# Patient Record
Sex: Female | Born: 1960 | Race: White | Hispanic: No | Smoking: Former smoker
Health system: Southern US, Community
[De-identification: ages and names within clinical notes are randomized; demographics above are authoritative.]

## PROBLEM LIST (undated history)

## (undated) DIAGNOSIS — Z720 Tobacco use: Secondary | ICD-10-CM

## (undated) DIAGNOSIS — Z9289 Personal history of other medical treatment: Secondary | ICD-10-CM

## (undated) DIAGNOSIS — Z8701 Personal history of pneumonia (recurrent): Secondary | ICD-10-CM

## (undated) DIAGNOSIS — Z8489 Family history of other specified conditions: Secondary | ICD-10-CM

## (undated) DIAGNOSIS — I10 Essential (primary) hypertension: Secondary | ICD-10-CM

## (undated) DIAGNOSIS — I428 Other cardiomyopathies: Secondary | ICD-10-CM

## (undated) DIAGNOSIS — Q390 Atresia of esophagus without fistula: Secondary | ICD-10-CM

## (undated) HISTORY — PX: TUBAL LIGATION: SHX77

## (undated) HISTORY — PX: TONSILLECTOMY: SUR1361

## (undated) HISTORY — DX: Personal history of other medical treatment: Z92.89

## (undated) HISTORY — PX: ESOPHAGUS SURGERY: SHX626

---

## 1998-04-11 ENCOUNTER — Inpatient Hospital Stay: Admission: EM | Admit: 1998-04-11 | Discharge: 1998-04-24 | Payer: Self-pay | Admitting: Emergency Medicine

## 1998-04-11 ENCOUNTER — Encounter: Payer: Self-pay | Admitting: Emergency Medicine

## 1998-04-14 ENCOUNTER — Encounter: Payer: Self-pay | Admitting: Internal Medicine

## 1998-04-15 ENCOUNTER — Encounter: Payer: Self-pay | Admitting: Internal Medicine

## 1998-04-16 ENCOUNTER — Encounter: Payer: Self-pay | Admitting: Internal Medicine

## 1998-04-17 ENCOUNTER — Encounter: Payer: Self-pay | Admitting: Thoracic Surgery

## 1998-04-18 ENCOUNTER — Encounter: Payer: Self-pay | Admitting: Thoracic Surgery

## 1998-04-19 ENCOUNTER — Encounter: Payer: Self-pay | Admitting: Thoracic Surgery

## 1998-04-20 ENCOUNTER — Encounter: Payer: Self-pay | Admitting: Thoracic Surgery

## 1998-04-21 ENCOUNTER — Encounter: Payer: Self-pay | Admitting: Thoracic Surgery

## 1998-04-22 ENCOUNTER — Encounter: Payer: Self-pay | Admitting: Thoracic Surgery

## 1998-04-23 ENCOUNTER — Encounter: Payer: Self-pay | Admitting: Thoracic Surgery

## 1998-04-24 ENCOUNTER — Encounter: Payer: Self-pay | Admitting: Thoracic Surgery

## 1999-05-07 ENCOUNTER — Other Ambulatory Visit: Admission: RE | Admit: 1999-05-07 | Discharge: 1999-05-07 | Payer: Self-pay | Admitting: Obstetrics and Gynecology

## 1999-08-10 ENCOUNTER — Ambulatory Visit (HOSPITAL_COMMUNITY): Admission: RE | Admit: 1999-08-10 | Discharge: 1999-08-10 | Payer: Self-pay | Admitting: Obstetrics and Gynecology

## 2001-08-14 ENCOUNTER — Emergency Department (HOSPITAL_COMMUNITY): Admission: EM | Admit: 2001-08-14 | Discharge: 2001-08-14 | Payer: Self-pay | Admitting: Emergency Medicine

## 2006-01-16 ENCOUNTER — Encounter: Admission: RE | Admit: 2006-01-16 | Discharge: 2006-01-16 | Payer: Self-pay | Admitting: Orthopaedic Surgery

## 2006-02-06 ENCOUNTER — Encounter: Admission: RE | Admit: 2006-02-06 | Discharge: 2006-02-06 | Payer: Self-pay | Admitting: Orthopaedic Surgery

## 2012-09-10 ENCOUNTER — Encounter (HOSPITAL_COMMUNITY): Payer: Self-pay | Admitting: Emergency Medicine

## 2012-09-10 ENCOUNTER — Emergency Department (HOSPITAL_COMMUNITY)
Admission: EM | Admit: 2012-09-10 | Discharge: 2012-09-10 | Disposition: A | Discharge status: Home / Self Care | Payer: BC Managed Care – PPO | Attending: Emergency Medicine | Admitting: Emergency Medicine

## 2012-09-10 ENCOUNTER — Emergency Department (HOSPITAL_COMMUNITY): Payer: BC Managed Care – PPO

## 2012-09-10 DIAGNOSIS — F17200 Nicotine dependence, unspecified, uncomplicated: Secondary | ICD-10-CM | POA: Insufficient documentation

## 2012-09-10 DIAGNOSIS — Z23 Encounter for immunization: Secondary | ICD-10-CM | POA: Insufficient documentation

## 2012-09-10 DIAGNOSIS — Y9289 Other specified places as the place of occurrence of the external cause: Secondary | ICD-10-CM | POA: Insufficient documentation

## 2012-09-10 DIAGNOSIS — S61209A Unspecified open wound of unspecified finger without damage to nail, initial encounter: Secondary | ICD-10-CM | POA: Insufficient documentation

## 2012-09-10 DIAGNOSIS — S6710XA Crushing injury of unspecified finger(s), initial encounter: Secondary | ICD-10-CM | POA: Insufficient documentation

## 2012-09-10 DIAGNOSIS — Y9389 Activity, other specified: Secondary | ICD-10-CM | POA: Insufficient documentation

## 2012-09-10 DIAGNOSIS — S61219A Laceration without foreign body of unspecified finger without damage to nail, initial encounter: Secondary | ICD-10-CM

## 2012-09-10 DIAGNOSIS — R209 Unspecified disturbances of skin sensation: Secondary | ICD-10-CM | POA: Insufficient documentation

## 2012-09-10 DIAGNOSIS — W230XXA Caught, crushed, jammed, or pinched between moving objects, initial encounter: Secondary | ICD-10-CM | POA: Insufficient documentation

## 2012-09-10 MED ORDER — OXYCODONE-ACETAMINOPHEN 5-325 MG PO TABS
1.0000 | ORAL_TABLET | Freq: Once | ORAL | Status: AC
  Administered 2012-09-10: 1 via ORAL
  Filled 2012-09-10: qty 1

## 2012-09-10 MED ORDER — TETANUS-DIPHTH-ACELL PERTUSSIS 5-2.5-18.5 LF-MCG/0.5 IM SUSP
0.5000 mL | Freq: Once | INTRAMUSCULAR | Status: AC
  Administered 2012-09-10: 0.5 mL via INTRAMUSCULAR
  Filled 2012-09-10: qty 0.5

## 2012-09-10 MED ORDER — HYDROCODONE-ACETAMINOPHEN 5-325 MG PO TABS: 1.0000 | ORAL_TABLET | ORAL | Status: DC | PRN

## 2012-09-10 MED ORDER — IBUPROFEN 400 MG PO TABS
400.0000 mg | ORAL_TABLET | Freq: Once | ORAL | Status: AC
  Administered 2012-09-10: 400 mg via ORAL
  Filled 2012-09-10: qty 1

## 2012-09-10 NOTE — ED Provider Notes (Signed)
History    CSN: [id number] Arrival date & time 09/10/12  1948  First MD Initiated Contact with Patient 09/10/12 2044     Chief Complaint  Patient presents with  . Finger Injury   HPI  History provided by the patient. Patient is a 52 year old female who presents with left finger injuries and laceration. Patient asked me smashed her left index and middle fingers in a car door around 6:30 PM prior to arrival. Patient did use peroxide and bandaging over her injuries and wounds. There was small amounts of bleeding associated with injury. She denies any other injury or complaints. There is very slight numbness to the tips of fingers. No other weakness or reduced range of motion. Patient is unsure of her last tetanus shot. Pain is moderate. No other aggravating or alleviating factors. No other associated symptoms.    History reviewed. No pertinent past medical history. Past Surgical History  Procedure Laterality Date  . Tubal ligation     No family history on file. History  Substance Use Topics  . Smoking status: Current Every Day Smoker  . Smokeless tobacco: Not on file  . Alcohol Use: Yes   OB History   Grav Para Term Preterm Abortions TAB SAB Ect Mult Living                 Review of Systems  All other systems reviewed and are negative.    Allergies  Sulfa antibiotics  Home Medications   Current Outpatient Rx  Name  Route  Sig  Dispense  Refill  . Calcium Carbonate-Vitamin D (CALTRATE 600+D PO)   Oral   Take 1 tablet by mouth daily.         Marland Kitchen ibuprofen (ADVIL,MOTRIN) 200 MG tablet   Oral   Take 600 mg by mouth once.         Bertram Gala Glycol-Propyl Glycol (SYSTANE) 0.4-0.3 % GEL   Ophthalmic   Apply 2 drops to eye daily as needed (for allergies).          BP 173/149  Pulse 114  Temp(Src) 98.4 F (36.9 C) (Oral)  Resp 20  SpO2 99% Physical Exam  Nursing note and vitals reviewed. Constitutional: She is oriented to person, place, and time. She  appears well-developed and well-nourished. No distress.  HENT:  Head: Normocephalic.  Cardiovascular: Normal rate and regular rhythm.   Pulmonary/Chest: Effort normal and breath sounds normal. No respiratory distress.  Musculoskeletal: She exhibits tenderness.  Moderate swelling and tenderness to the distal right second and third digits. There is a laceration to the pad of the third digit. Bleeding controlled. There is normal sensation to the medial and lateral aspects of both distal fingertips. Normal cap refill less than 2 seconds. Full range of motion.  Neurological: She is alert and oriented to person, place, and time.  Skin: Skin is warm and dry.  Psychiatric: She has a normal mood and affect. Her behavior is normal.    ED Course  Procedures   LACERATION REPAIR Performed by: Angus Seller Authorized by: Angus Seller Consent: Verbal consent obtained. Risks and benefits: risks, benefits and alternatives were discussed Consent given by: patient Patient identity confirmed: provided demographic data Prepped and Draped in normal sterile fashion Wound explored  Laceration Location: Right distal middle finger  Laceration Length: 1 cm  No Foreign Bodies seen or palpated  Anesthesia: Digital block   Local anesthetic: lidocaine 2% without epinephrine  Anesthetic total: 4 ml  Irrigation method: syringe Amount of  cleaning: standard  Skin closure: Skin with 4-0 Vicryl   Number of sutures: 5   Technique: Simple interrupted   Patient tolerance: Patient tolerated the procedure well with no immediate complications.      Dg Finger Index Right  09/10/2012   *RADIOLOGY REPORT*  Clinical Data: Trauma.  Pain and swelling.  RIGHT INDEX FINGER 2+V,RIGHT MIDDLE FINGER 2+V  Comparison: None.  Findings: 3 views of the right index finger demonstrate no fracture or dislocation.  There may be mild soft tissue swelling, especially about the proximal phalanx.  Three views centered about  the middle finger demonstrate a bandage overlying the middle and distal phalanx. No acute fracture or dislocation.  Soft tissue swelling identified about the proximal phalanx.  IMPRESSION: Soft tissue swelling about the index and middle fingers of the right hand, without acute osseous finding.   Original Report Authenticated By: Jeronimo Greaves, M.D.   Dg Finger Middle Right  09/10/2012   *RADIOLOGY REPORT*  Clinical Data: Trauma.  Pain and swelling.  RIGHT INDEX FINGER 2+V,RIGHT MIDDLE FINGER 2+V  Comparison: None.  Findings: 3 views of the right index finger demonstrate no fracture or dislocation.  There may be mild soft tissue swelling, especially about the proximal phalanx.  Three views centered about the middle finger demonstrate a bandage overlying the middle and distal phalanx. No acute fracture or dislocation.  Soft tissue swelling identified about the proximal phalanx.  IMPRESSION: Soft tissue swelling about the index and middle fingers of the right hand, without acute osseous finding.   Original Report Authenticated By: Jeronimo Greaves, M.D.    1. Crush injury to finger, initial encounter   2. Laceration of finger of right hand, initial encounter       MDM  Patient seen and evaluated. She appears in moderate discomfort in no acute distress. X-rays without any fractures to the fingers. No other concerning findings on exam.      Angus Seller, PA-C 09/10/12 2243

## 2012-09-10 NOTE — ED Notes (Signed)
PT. ACCIDENTALLY CAUGHT  HER RIGHT RIGHT DISTAL MIDDLE /INDEXFINGER AGAINST CAR DOOR THIS EVENING .

## 2012-09-10 NOTE — ED Notes (Signed)
MD at bedside. 

## 2012-09-10 NOTE — ED Provider Notes (Signed)
Medical screening examination/treatment/procedure(s) were performed by non-physician practitioner and as supervising physician I was immediately available for consultation/collaboration.  Jensine Luz, MD 09/10/12 2317 

## 2012-09-10 NOTE — ED Notes (Signed)
Suture cart placed at bedside. 

## 2015-08-12 ENCOUNTER — Inpatient Hospital Stay (HOSPITAL_COMMUNITY): Payer: 59

## 2015-08-12 ENCOUNTER — Observation Stay (HOSPITAL_COMMUNITY)
Admission: EM | Admit: 2015-08-12 | Discharge: 2015-08-14 | Disposition: A | Discharge status: Home / Self Care | Payer: 59 | Attending: Oncology | Admitting: Oncology

## 2015-08-12 ENCOUNTER — Encounter (HOSPITAL_COMMUNITY): Payer: Self-pay | Admitting: *Deleted

## 2015-08-12 ENCOUNTER — Emergency Department (HOSPITAL_COMMUNITY): Payer: 59

## 2015-08-12 DIAGNOSIS — I160 Hypertensive urgency: Secondary | ICD-10-CM | POA: Diagnosis not present

## 2015-08-12 DIAGNOSIS — F1910 Other psychoactive substance abuse, uncomplicated: Secondary | ICD-10-CM | POA: Diagnosis not present

## 2015-08-12 DIAGNOSIS — I509 Heart failure, unspecified: Secondary | ICD-10-CM

## 2015-08-12 DIAGNOSIS — I10 Essential (primary) hypertension: Secondary | ICD-10-CM | POA: Diagnosis not present

## 2015-08-12 DIAGNOSIS — R0600 Dyspnea, unspecified: Secondary | ICD-10-CM

## 2015-08-12 DIAGNOSIS — Z720 Tobacco use: Secondary | ICD-10-CM

## 2015-08-12 DIAGNOSIS — F17200 Nicotine dependence, unspecified, uncomplicated: Secondary | ICD-10-CM

## 2015-08-12 DIAGNOSIS — F17210 Nicotine dependence, cigarettes, uncomplicated: Secondary | ICD-10-CM | POA: Diagnosis not present

## 2015-08-12 DIAGNOSIS — I5042 Chronic combined systolic (congestive) and diastolic (congestive) heart failure: Secondary | ICD-10-CM

## 2015-08-12 DIAGNOSIS — J440 Chronic obstructive pulmonary disease with acute lower respiratory infection: Secondary | ICD-10-CM | POA: Insufficient documentation

## 2015-08-12 DIAGNOSIS — I429 Cardiomyopathy, unspecified: Secondary | ICD-10-CM | POA: Diagnosis not present

## 2015-08-12 DIAGNOSIS — I428 Other cardiomyopathies: Secondary | ICD-10-CM

## 2015-08-12 DIAGNOSIS — J810 Acute pulmonary edema: Secondary | ICD-10-CM | POA: Diagnosis not present

## 2015-08-12 DIAGNOSIS — I110 Hypertensive heart disease with heart failure: Secondary | ICD-10-CM | POA: Diagnosis not present

## 2015-08-12 DIAGNOSIS — R0689 Other abnormalities of breathing: Secondary | ICD-10-CM

## 2015-08-12 DIAGNOSIS — J449 Chronic obstructive pulmonary disease, unspecified: Secondary | ICD-10-CM | POA: Diagnosis not present

## 2015-08-12 DIAGNOSIS — I5041 Acute combined systolic (congestive) and diastolic (congestive) heart failure: Secondary | ICD-10-CM | POA: Insufficient documentation

## 2015-08-12 DIAGNOSIS — R0603 Acute respiratory distress: Secondary | ICD-10-CM | POA: Diagnosis present

## 2015-08-12 HISTORY — DX: Personal history of pneumonia (recurrent): Z87.01

## 2015-08-12 HISTORY — DX: Tobacco use: Z72.0

## 2015-08-12 HISTORY — DX: Essential (primary) hypertension: I10

## 2015-08-12 HISTORY — DX: Family history of other specified conditions: Z84.89

## 2015-08-12 HISTORY — DX: Other cardiomyopathies: I42.8

## 2015-08-12 HISTORY — DX: Atresia of esophagus without fistula: Q39.0

## 2015-08-12 LAB — BASIC METABOLIC PANEL
ANION GAP: 14 (ref 5–15)
BUN: 9 mg/dL (ref 6–20)
CHLORIDE: 107 mmol/L (ref 101–111)
CO2: 20 mmol/L — ABNORMAL LOW (ref 22–32)
CREATININE: 1.06 mg/dL — AB (ref 0.44–1.00)
Calcium: 8.9 mg/dL (ref 8.9–10.3)
GFR calc non Af Amer: 59 mL/min — ABNORMAL LOW (ref >60)
Glucose, Bld: 192 mg/dL — ABNORMAL HIGH (ref 65–99)
POTASSIUM: 4.2 mmol/L (ref 3.5–5.1)
SODIUM: 141 mmol/L (ref 135–145)

## 2015-08-12 LAB — TROPONIN I
TROPONIN I: 0.06 ng/mL — AB (ref <0.031)
Troponin I: 0.06 ng/mL — ABNORMAL HIGH (ref <0.031)

## 2015-08-12 LAB — LIPID PANEL
CHOL/HDL RATIO: 3.4 ratio
CHOLESTEROL: 247 mg/dL — AB (ref 0–200)
HDL: 72 mg/dL (ref >40)
LDL Cholesterol: 160 mg/dL — ABNORMAL HIGH (ref 0–99)
TRIGLYCERIDES: 76 mg/dL (ref <150)
VLDL: 15 mg/dL (ref 0–40)

## 2015-08-12 LAB — CBC WITH DIFFERENTIAL/PLATELET
BASOS ABS: 0.1 10*3/uL (ref 0.0–0.1)
BASOS PCT: 0 %
EOS ABS: 0.1 10*3/uL (ref 0.0–0.7)
Eosinophils Relative: 1 %
HEMATOCRIT: 42.3 % (ref 36.0–46.0)
HEMOGLOBIN: 13.6 g/dL (ref 12.0–15.0)
Lymphocytes Relative: 26 %
Lymphs Abs: 3.9 10*3/uL (ref 0.7–4.0)
MCH: 30.8 pg (ref 26.0–34.0)
MCHC: 32.2 g/dL (ref 30.0–36.0)
MCV: 95.9 fL (ref 78.0–100.0)
Monocytes Absolute: 0.3 10*3/uL (ref 0.1–1.0)
Monocytes Relative: 2 %
NEUTROS ABS: 10.8 10*3/uL — AB (ref 1.7–7.7)
NEUTROS PCT: 71 %
Platelets: 342 10*3/uL (ref 150–400)
RBC: 4.41 MIL/uL (ref 3.87–5.11)
RDW: 14 % (ref 11.5–15.5)
WBC: 15.2 10*3/uL — AB (ref 4.0–10.5)

## 2015-08-12 LAB — I-STAT ARTERIAL BLOOD GAS, ED
ACID-BASE DEFICIT: 6 mmol/L — AB (ref 0.0–2.0)
Bicarbonate: 19.1 mEq/L — ABNORMAL LOW (ref 20.0–24.0)
O2 Saturation: 96 %
PH ART: 7.351 (ref 7.350–7.450)
TCO2: 20 mmol/L (ref 0–100)
pCO2 arterial: 34.6 mmHg — ABNORMAL LOW (ref 35.0–45.0)
pO2, Arterial: 83 mmHg (ref 80.0–100.0)

## 2015-08-12 LAB — RAPID URINE DRUG SCREEN, HOSP PERFORMED
AMPHETAMINES: NOT DETECTED
BARBITURATES: NOT DETECTED
BENZODIAZEPINES: NOT DETECTED
COCAINE: NOT DETECTED
OPIATES: POSITIVE — AB
TETRAHYDROCANNABINOL: NOT DETECTED

## 2015-08-12 LAB — GLUCOSE, CAPILLARY: Glucose-Capillary: 154 mg/dL — ABNORMAL HIGH (ref 65–99)

## 2015-08-12 LAB — BRAIN NATRIURETIC PEPTIDE: B NATRIURETIC PEPTIDE 5: 1043 pg/mL — AB (ref 0.0–100.0)

## 2015-08-12 LAB — I-STAT TROPONIN, ED: TROPONIN I, POC: 0.03 ng/mL (ref 0.00–0.08)

## 2015-08-12 LAB — D-DIMER, QUANTITATIVE (NOT AT ARMC): D DIMER QUANT: 2.82 ug{FEU}/mL — AB (ref 0.00–0.50)

## 2015-08-12 LAB — PROCALCITONIN: Procalcitonin: <0.1 ng/mL

## 2015-08-12 MED ORDER — FUROSEMIDE 10 MG/ML IJ SOLN
40.0000 mg | Freq: Once | INTRAMUSCULAR | Status: AC
  Administered 2015-08-12: 40 mg via INTRAVENOUS
  Filled 2015-08-12: qty 4

## 2015-08-12 MED ORDER — SODIUM CHLORIDE 0.9% FLUSH
3.0000 mL | Freq: Two times a day (BID) | INTRAVENOUS | Status: DC
  Administered 2015-08-12 – 2015-08-14 (×2): 3 mL via INTRAVENOUS

## 2015-08-12 MED ORDER — IPRATROPIUM-ALBUTEROL 0.5-2.5 (3) MG/3ML IN SOLN: 3.0000 mL | RESPIRATORY_TRACT | Status: DC | PRN

## 2015-08-12 MED ORDER — ACETAMINOPHEN 325 MG PO TABS: 650.0000 mg | ORAL_TABLET | Freq: Four times a day (QID) | ORAL | Status: DC | PRN

## 2015-08-12 MED ORDER — IOPAMIDOL (ISOVUE-370) INJECTION 76%
INTRAVENOUS | Status: AC
  Administered 2015-08-12: 80 mL
  Filled 2015-08-12: qty 100

## 2015-08-12 MED ORDER — SENNOSIDES-DOCUSATE SODIUM 8.6-50 MG PO TABS: 1.0000 | ORAL_TABLET | Freq: Every evening | ORAL | Status: DC | PRN

## 2015-08-12 MED ORDER — IOPAMIDOL (ISOVUE-370) INJECTION 76%
INTRAVENOUS | Status: AC
  Filled 2015-08-12: qty 100

## 2015-08-12 MED ORDER — LABETALOL HCL 5 MG/ML IV SOLN
5.0000 mg | INTRAVENOUS | Status: DC | PRN
  Administered 2015-08-12: 5 mg via INTRAVENOUS
  Filled 2015-08-12: qty 4

## 2015-08-12 MED ORDER — IPRATROPIUM-ALBUTEROL 0.5-2.5 (3) MG/3ML IN SOLN
3.0000 mL | Freq: Four times a day (QID) | RESPIRATORY_TRACT | Status: DC
  Administered 2015-08-12 (×2): 3 mL via RESPIRATORY_TRACT
  Filled 2015-08-12 (×2): qty 3

## 2015-08-12 MED ORDER — NITROGLYCERIN 2 % TD OINT
1.0000 [in_us] | TOPICAL_OINTMENT | Freq: Once | TRANSDERMAL | Status: AC
  Administered 2015-08-12: 1 [in_us] via TOPICAL
  Filled 2015-08-12: qty 1

## 2015-08-12 MED ORDER — IPRATROPIUM-ALBUTEROL 0.5-2.5 (3) MG/3ML IN SOLN
3.0000 mL | Freq: Once | RESPIRATORY_TRACT | Status: AC
  Administered 2015-08-12: 3 mL via RESPIRATORY_TRACT
  Filled 2015-08-12: qty 3

## 2015-08-12 MED ORDER — ACETAMINOPHEN 650 MG RE SUPP: 650.0000 mg | Freq: Four times a day (QID) | RECTAL | Status: DC | PRN

## 2015-08-12 MED ORDER — ENOXAPARIN SODIUM 40 MG/0.4ML ~~LOC~~ SOLN
40.0000 mg | SUBCUTANEOUS | Status: DC
Start: 2015-08-12 — End: 2015-08-14
  Administered 2015-08-12 – 2015-08-13 (×2): 40 mg via SUBCUTANEOUS
  Filled 2015-08-12 (×2): qty 0.4

## 2015-08-12 MED ORDER — IPRATROPIUM-ALBUTEROL 0.5-2.5 (3) MG/3ML IN SOLN
3.0000 mL | Freq: Three times a day (TID) | RESPIRATORY_TRACT | Status: DC
  Administered 2015-08-13 – 2015-08-14 (×4): 3 mL via RESPIRATORY_TRACT
  Filled 2015-08-12 (×5): qty 3

## 2015-08-12 MED ORDER — ASPIRIN 81 MG PO CHEW
324.0000 mg | CHEWABLE_TABLET | Freq: Once | ORAL | Status: AC
Start: 2015-08-12 — End: 2015-08-12
  Administered 2015-08-12: 324 mg via ORAL
  Filled 2015-08-12: qty 4

## 2015-08-12 MED ORDER — LISINOPRIL 10 MG PO TABS
5.0000 mg | ORAL_TABLET | Freq: Every day | ORAL | Status: DC
Start: 2015-08-12 — End: 2015-08-12
  Administered 2015-08-12: 5 mg via ORAL
  Filled 2015-08-12: qty 1

## 2015-08-12 NOTE — ED Notes (Signed)
When patient taken off C PAP to reposition in the bed, sats dropped to 88%  BiPAP applied

## 2015-08-12 NOTE — ED Provider Notes (Addendum)
CSN: [id number]     Arrival date & time 08/12/15  4098 History   First MD Initiated Contact with Patient 08/12/15 0700     Chief Complaint  Patient presents with  . Respiratory Distress     (Consider location/radiation/quality/duration/timing/severity/associated sxs/prior Treatment) HPI Patient presents with shortness of breath by EMS. Acute onset of shortness of breath this morning after getting out of shower. She is a smoker but no history of CHF or COPD. O2 sats reported in the 70s by EMS. She was placed on BiPAP for work of breathing. They noted wheezing and she received 10mg  albuterol and 125mg  solumedrol which she states made her feel better. Hypertensive and tachycardic in route. Denies chest pain. No leg swelling.No H/O DVT. Reports 2 weeks of dry cough with no fever. Smoke about 1 PPD. Had an inhaler in the past but not now. No known H/O COPD. Past Medical History  Diagnosis Date  . Hypertension    Past Surgical History  Procedure Laterality Date  . Tubal ligation     No family history on file. Social History  Substance Use Topics  . Smoking status: Current Every Day Smoker  . Smokeless tobacco: Never Used  . Alcohol Use: Yes   OB History    No data available     Review of Systems 10 Systems reviewed and are negative for acute change except as noted in the HPI.   Allergies  Sulfa antibiotics  Home Medications   Prior to Admission medications   Medication Sig Start Date End Date Taking? Authorizing Provider  Calcium Carbonate-Vitamin D (CALTRATE 600+D PO) Take 1 tablet by mouth daily.    Historical Provider, MD  HYDROcodone-acetaminophen (NORCO) 5-325 MG per tablet Take 1-2 tablets by mouth every 4 (four) hours as needed for pain. 09/10/12   Ivonne Andrew, PA-C  ibuprofen (ADVIL,MOTRIN) 200 MG tablet Take 600 mg by mouth once.    Historical Provider, MD  Polyethyl Glycol-Propyl Glycol (SYSTANE) 0.4-0.3 % GEL Apply 2 drops to eye daily as needed (for allergies).     Historical Provider, MD   BP 166/92 mmHg  Pulse 86  Temp(Src) 97.8 F (36.6 C) (Rectal)  Resp 22  SpO2 100% Physical Exam  Constitutional: She is oriented to person, place, and time. She appears well-developed and well-nourished.  Moderate increased work of breathing on BiPAP. Patient is awake and alert. She is able to speak in short. Sentences. Color is good. Mental status is clear.  HENT:  Head: Normocephalic and atraumatic.  Eyes: EOM are normal. Pupils are equal, round, and reactive to light.  Neck: Neck supple.  Cardiovascular: Normal rate, regular rhythm, normal heart sounds and intact distal pulses.   Pulmonary/Chest:  Moderate increased work of breathing. Right lung field clear. Left lung field Rales at left base posteriorly. Anterior auscultation some expiratory wheeze present. On the left only.  Abdominal: Soft. Bowel sounds are normal. She exhibits no distension. There is no tenderness.  Musculoskeletal: Normal range of motion. She exhibits no edema or tenderness.  Lower extremities excellent condition. No peripheral edema. Skin condition very good. Calves soft nontender.  Neurological: She is alert and oriented to person, place, and time. She has normal strength. She exhibits normal muscle tone. Coordination normal. GCS eye subscore is 4. GCS verbal subscore is 5. GCS motor subscore is 6.  Skin: Skin is warm, dry and intact.  Psychiatric: She has a normal mood and affect.    ED Course  Procedures (including critical care time) CRITICAL CARE  Performed by: Arby Barrette   Total critical care time: 45 minutes  Critical care time was exclusive of separately billable procedures and treating other patients.  Critical care was necessary to treat or prevent imminent or life-threatening deterioration.  Critical care was time spent personally by me on the following activities: development of treatment plan with patient and/or surrogate as well as nursing, discussions with  consultants, evaluation of patient's response to treatment, examination of patient, obtaining history from patient or surrogate, ordering and performing treatments and interventions, ordering and review of laboratory studies, ordering and review of radiographic studies, pulse oximetry and re-evaluation of patient's condition. Labs Review Labs Reviewed  CBC WITH DIFFERENTIAL/PLATELET - Abnormal; Notable for the following:    WBC 15.2 (*)    Neutro Abs 10.8 (*)    All other components within normal limits  BASIC METABOLIC PANEL - Abnormal; Notable for the following:    CO2 20 (*)    Glucose, Bld 192 (*)    Creatinine, Ser 1.06 (*)    GFR calc non Af Amer 59 (*)    All other components within normal limits  BRAIN NATRIURETIC PEPTIDE - Abnormal; Notable for the following:    B Natriuretic Peptide 1043.0 (*)    All other components within normal limits  D-DIMER, QUANTITATIVE (NOT AT [facility]) - Abnormal; Notable for the following:    D-Dimer, Quant 2.82 (*)    All other components within normal limits  I-STAT ARTERIAL BLOOD GAS, ED - Abnormal; Notable for the following:    pCO2 arterial 34.6 (*)    Bicarbonate 19.1 (*)    Acid-base deficit 6.0 (*)    All other components within normal limits  I-STAT TROPOININ, ED    Imaging Review Dg Chest Portable 1 View  08/12/2015  CLINICAL DATA:  Shortness of breath and cough for 2 weeks, history hypertension, smoking EXAM: PORTABLE CHEST 1 VIEW COMPARISON:  Portable exam 0716 hours ; prior studies from 2000 predate PACs and not available for comparison FINDINGS: Enlargement of cardiac silhouette. Mediastinal contours normal. Atelectasis versus scarring at minor fissure. Interstitial infiltrates in the mid to lower lungs bilaterally could reflect acute edema, atypical infection or fibrosis, stability uncertain in the absence of prior exams. No pleural effusion or pneumothorax. Bones unremarkable. IMPRESSION: Enlargement of cardiac silhouette. Interstitial  infiltrates in the mid to lower lungs question edema, atypical infection or fibrosis. Electronically Signed   By: Ulyses Southward M.D.   On: 08/12/2015 07:38   I have personally reviewed and evaluated these images and lab results as part of my medical decision-making.   EKG Interpretation   Date/Time:  Wednesday August 12 2015 06:56:23 EDT Ventricular Rate:  114 PR Interval:  166 QRS Duration: 107 QT Interval:  312 QTC Calculation: 430 R Axis:   80 Text Interpretation:  Sinus tachycardia Biatrial enlargement LVH with  secondary repolarization abnormality Confirmed by HORTON  MD, COURTNEY  ([id number]) on 08/12/2015 7:00:08 AM     Recheck of 9:10. Patient is off BiPAP and vital signs are stable. She is on nonrebreather mask and is speaking in full sentences with mild increased work of breathing but no signs of fatigue or mental status change. Patient denies any chest pain. She reports throughout all of this she has never experienced any chest pain.  Consult: (09:50) Internal medicine Dr. Bronson Curb will see in the emergency department for admission. Consult: (10:15) Trish of cardiology will assign for cardiology consultation. MDM   Final diagnoses:  Acute congestive heart failure, unspecified  congestive heart failure type (HCC)  Dyspnea and respiratory abnormalities   Patient presents with very acute exacerbation of shortness of breath. She identified some dry cough occasionally for a couple weeks but no significant symptoms. This morning she became very dyspneic quickly. Patient had no associated chest pain. She has had hypertension that has been untreated at times. She reports that she has been compliant with blood pressure medications more recently but does not get regular medical care. She does continue to smoke. Chest x-ray showed cardiomegaly with vascular congestion, EKG shows LVH and pulmonary examination had basilar Rales on the left. Troponin is not elevated and patient has no chest pain at this  time there is not evidence of acute ischemic cardiac event. Patient is treated with IV Lasix and nitroglycerin paste for acute pulmonary edema consistent with acute CHF.    Arby Barrette, MD 08/12/15 1019  Arby Barrette, MD 08/12/15 1335

## 2015-08-12 NOTE — ED Notes (Signed)
Pt ambulated to restroom from room. Pt stated she felt more short of breath while walking to restroom, but no complaints of SOB on the way back. Pt placed back onto monitor, O2 RM was 92%.

## 2015-08-12 NOTE — H&P (Signed)
Date: 08/12/2015               Patient Name:  Rachel Kane MRN: [id number]  DOB: June 26, 1960 Age / Sex: 55 y.o., female   PCP: No Pcp Per Patient           Medical Service: Internal Medicine Teaching Service         Attending Physician: Dr. Levert Feinstein, MD    First Contact: Dr. Ladona Ridgel, MD Pager: [phone]  Second Contact: Dr. Allena Katz, MD Pager: [phone]       After Hours (After 5p/  First Contact Pager: [phone]  weekends / holidays): Second Contact Pager: [phone]    Most Recent Discharge Date:  09/10/12  Chief Complaint:  Chief Complaint  Patient presents with  . Respiratory Distress       History of Present Illness:  Rachel Kane is a 55 y.o. female with a PMH of HTN, pneumonia, and bronchitis presents to the ED with acute onset of dyspnea this AM.  She reports waking up around 4AM to urinate and tried to go back to sleep and woke up around 5AM with shortness of breath.  She endorses some diaphoresis with this episode and chronic nonproductive cough.  She denies associated wheezing, chest pain, palpitations, fever/chills, hemoptysis, N/V, decreased appetite. Denies orthopnea, dyspnea with exertion, LE swelling, history of VTE.  She's had bronchitis in the past but never "anything like this."  She smokes about 1ppd for the past 20+ years, drinks about 2-3 [employer] hard lemonade.  She denies any recreational drug use.  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  She denies chronic dyspnea with exertion.  There is a FH of VTE in her mother that was attributed to OCPs.  She reports having a "charlie horse" in her RLE about 2 weeks ago.  Denies any recent long distance travel or recent surgeries or injuries.  No history of CHF or COPD.  States she did have an inhaler when she had bronchitis.  She does not go to the doctor on a regular basis but has been in the [place] system.  She takes hydrochlorothiazide for blood pressure and uses claritin occasionally for allergies.       Prior to arrival in the ED, O2 sat 77% per EMS.  BP initially was 230/160 with HR 119.  She was started on BIPAP, given solumedrol and albuterol with improvement of O2 sats.  1V CXR showed enlargement of the cardiac silhouette with interstitial infiltrates in the mid/lower lungs with questionable edema, atypical infection, or fibrosis.  EKG with sinus tachycardia.  Labs remarkable for an elevated d-dimer 2.82, BNP 1043, leukocytosis of 15.2, and SCr of 1.06.   ABG unremarkable.  She was given 40mg  lasix, ASA, and NTG in the ED.  She is now on nasal canula.    Meds: Current Facility-Administered Medications  Medication Dose Route Frequency Provider Last Rate Last Dose  . iopamidol (ISOVUE-370) 76 % injection            Current Outpatient Prescriptions  Medication Sig Dispense Refill  . hydrochlorothiazide (HYDRODIURIL) 25 MG tablet Take 25 mg by mouth daily.    Marland Kitchen loratadine (CLARITIN) 10 MG tablet Take 10 mg by mouth daily.      Allergies: Allergies as of 08/12/2015 - Review Complete 08/12/2015  Allergen Reaction Noted  . Sulfa antibiotics Other (See Comments) 09/10/2012    PMH: Past Medical History  Diagnosis Date  . Essential hypertension   .  Tobacco abuse   . 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  Marland Kitchen History of pneumonia     PSH: Past Surgical History  Procedure Laterality Date  . Tubal ligation    . Esophagus surgery      a. 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    FH: No family history on file.  SH: Social History  Substance Use Topics  . Smoking status: Current Every Day Smoker  . Smokeless tobacco: Never Used  . Alcohol Use: Yes    Review of Systems: Pertinent items are noted in HPI.  All other systems reviewed and are negative.  Physical Exam: BP 176/89 mmHg  Pulse 86  Temp(Src) 97.8 F (36.6 C) (Rectal)  Resp 17  SpO2 97%  Physical Exam   Constitutional: Vital signs reviewed.  Patient is well-developed and well-nourished in no acute distress.   Head: Normocephalic and atraumatic Eyes: EOMI, conjunctivae normal, no scleral icterus.  Neck: Supple, no JVD appreciated.    Cardiovascular: RRR.   Pulmonary/Chest: Normal respiratory effort, CTAB, no wheezes, rales, or rhonchi. Abdominal: Soft. Non-tender, non-distended, bowel sounds are normal Extremities: Trace LE pitting edema b/l.   Neurological: A&O x3, cranial nerve II-XII are grossly intact, no focal motor deficit  Skin: Warm, dry and intact. No rash.  Lab results:  Basic Metabolic Panel:  Recent Labs  19/14/78 0700  NA 141  K 4.2  CL 107  CO2 20*  GLUCOSE 192*  BUN 9  CREATININE 1.06*  CALCIUM 8.9    Calcium/Magnesium/Phosphorus:  Recent Labs Lab 08/12/15 0700  CALCIUM 8.9    Liver Function Tests: No results for input(s): AST, ALT, ALKPHOS, BILITOT, PROT, ALBUMIN in the last 72 hours. No results for input(s): LIPASE, AMYLASE in the last 72 hours. No results for input(s): AMMONIA in the last 72 hours.  CBC: Lab Results  Component Value Date   WBC 15.2* 08/12/2015   HGB 13.6 08/12/2015   HCT 42.3 08/12/2015   MCV 95.9 08/12/2015   PLT 342 08/12/2015    Lipase: No results found for: LIPASE  Lactic Acid/Procalcitonin: No results for input(s): LATICACIDVEN, PROCALCITON, O2SATVEN in the last 168 hours.  Cardiac Enzymes:  Recent Labs  08/12/15 0711  TROPIPOC 0.03   No results found for: CKTOTAL, CKMB, CKMBINDEX, TROPONINI  BNP: No results for input(s): PROBNP in the last 72 hours.  D-Dimer:  Recent Labs  08/12/15 0730  DDIMER 2.82*    CBG: No results for input(s): GLUCAP in the last 72 hours.  Hemoglobin A1C: No results for input(s): HGBA1C in the last 72 hours.  Lipid Panel: No results for input(s): CHOL, HDL, LDLCALC, TRIG, CHOLHDL, LDLDIRECT in the last 72 hours.  Thyroid Function Tests: No results for input(s): TSH,  T4TOTAL, FREET4, T3FREE, THYROIDAB in the last 72 hours.  Anemia Panel: No results for input(s): VITAMINB12, FOLATE, FERRITIN, TIBC, IRON, RETICCTPCT in the last 72 hours.  Coagulation: No results for input(s): LABPROT, INR in the last 72 hours.  Urine Drug Screen: Drugs of Abuse:     Component Value Date/Time   LABOPIA POSITIVE* 08/12/2015 1005   COCAINSCRNUR NONE DETECTED 08/12/2015 1005   LABBENZ NONE DETECTED 08/12/2015 1005   AMPHETMU NONE DETECTED 08/12/2015 1005   THCU NONE DETECTED 08/12/2015 1005   LABBARB NONE DETECTED 08/12/2015 1005    Alcohol Level: No results for input(s): ETH in the last 72 hours.  Urinalysis:  No results found for: COLORURINE, APPEARANCEUR, LABSPEC, PHURINE, GLUCOSEU, HGBUR, BILIRUBINUR, KETONESUR, PROTEINUR, UROBILINOGEN, NITRITE, LEUKOCYTESUR  Imaging results:  Dg Chest Portable 1 View  08/12/2015  CLINICAL DATA:  Shortness of breath and cough for 2 weeks, history hypertension, smoking EXAM: PORTABLE CHEST 1 VIEW COMPARISON:  Portable exam 0716 hours ; prior studies from 2000 predate PACs and not available for comparison FINDINGS: Enlargement of cardiac silhouette. Mediastinal contours normal. Atelectasis versus scarring at minor fissure. Interstitial infiltrates in the mid to lower lungs bilaterally could reflect acute edema, atypical infection or fibrosis, stability uncertain in the absence of prior exams. No pleural effusion or pneumothorax. Bones unremarkable. IMPRESSION: Enlargement of cardiac silhouette. Interstitial infiltrates in the mid to lower lungs question edema, atypical infection or fibrosis. Electronically Signed   By: Ulyses Southward M.D.   On: 08/12/2015 07:38    EKG: EKG Interpretation  Date/Time:  Wednesday August 12 2015 06:56:23 EDT Ventricular Rate:  114 PR Interval:  166 QRS Duration: 107 QT Interval:  312 QTC Calculation: 430 R Axis:   80 Text Interpretation:  Sinus tachycardia Biatrial enlargement LVH with secondary  repolarization abnormality Confirmed by HORTON  MD, Toni Amend ([id number]) on 08/12/2015 7:00:08 AM   Antibiotics: Antibiotics Given (last 72 hours)    None      Anti-infectives    None     Consults: Treatment Team:  Rounding Lbcardiology, MD  Assessment & Plan by Problem: Principal Problem:   Acute respiratory distress (HCC) Active Problems:   Hypertension  Acute respiratory distress Unclear what the etiology is of her acute dyspnea.  CXR suggests enlargement of cardiac silhouette but only a one view. Previous chest CT 2000 with atelectasis of RLL/RML with R pleural effusion and minimal air bronchograms with possible mucus plugging, early abscess, and drowned lung.  She reports having previous PFTs at [facility] but I am unable to find these in the system.  Concerned for PE given the family history, tachycardia, hypoxia, tachypnea, elevated BNP and d-dimer.  Wells score 7.5, high risk.  Possible CHF but less likely given no orthopnea or dyspnea on exertion.  She does not appear volume overloaded.  Doubt PNA given afebrile.   -monitor on telemetry  -obtain LE dopplers -TTE -duonebs PRN -troponin, PCT  -consider CTA (SCr 1.06) -monitor I/Os, daily weights  -O2 PRN  -will need PFTs as outpatient   Hypertension She takes HCTZ at home.   -monitor for now, consider resuming HCTZ  -check lipid panel, HA1c, UDS  Polysubstance abuse Drinks 2-3 [employer] daily and smokes 1ppd.  -counsel on smoking and alcohol cessation  -check UDS   VTE prophylaxis  lovenox 40mg  SQ qd  Disposition Disposition deferred at this time, awaiting improvement of current medical problems. Anticipated discharge in approximately 1-2 day(s).    Emergency Contact Contact Information    Name Relation Home Work [place] Mother [phone]        The patient does not have a current PCP (No Pcp Per Patient) and does need an [facility] hospital follow-up appointment after  discharge.  Signed Marrian Salvage, MD Internal Medicine Teaching Service, PGY-3 08/12/2015, 11:27 AM

## 2015-08-12 NOTE — H&P (Addendum)
History & Physical    Patient ID: Rachel Kane MRN: [id number], DOB/AGE: [birth year]   Admit date: 08/12/2015   Primary Physician: No PCP Per Patient Primary Cardiologist: New - seen by Catalina Gravel, MD   Patient Profile    55 y/o ? with a h/o HTN and tobacco abuse, who presented to the ED this AM with acute dyspnea, hypoxia, and marked hypertension.  Past Medical History    Past Medical History  Diagnosis Date  . Essential hypertension     a. Dx @ least 20 years ago.  . Tobacco abuse   . 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  Marland Kitchen History of pneumonia     a. 2000: required chest tube.    Past Surgical History  Procedure Laterality Date  . Tubal ligation    . Esophagus surgery      a. 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     Allergies  Allergies  Allergen Reactions  . Sulfa Antibiotics Other (See Comments)    Childhood allergy-unknown    History of Present Illness    55 y/o ? with a h/o congenital esophageal atresia s/p corrective surgeries, HTN, and tobacco abuse.  She lives in [place], [place] and works locally.  She is relatively active @ work and generally does well w/o limitations.  Approximately 3-4 wks ago however, she began to note DOE while walking across parking lots during errands.  She has no h/o chest pain.  This AM, she awoke at ~ 4 am to use the bathroom.  After doing so, she returned to bed but felt restless and dyspneic.  Dyspnea acutely worsened over a 15 minute period and she called EMS.  By report, she was found to be hypoxic w/ an O2 sat pf 77%.  BP was initially 230/160 with a HR of 116.  She was placed on bipap in the ED and treated with solumedrol and albuterol.  ECG notable for sinus tach with LVH and repol abnormalities.  CXR showed cardiomegaly with interstitial infiltrates.  She was given 40 IV lasix and placed on nitropaste with good  response, BP improvement, and improved breathing.  She is now off of bipap and resting comfortably.  She denies any pnd, orthopnea, n, v, palpitations, or chest pain prior to her episode this AM.  Home Medications    Prior to Admission medications   Medication Sig Start Date End Date Taking? Authorizing Provider  hydrochlorothiazide (HYDRODIURIL) 25 MG tablet Take 25 mg by mouth daily.   Yes Historical Provider, MD  loratadine (CLARITIN) 10 MG tablet Take 10 mg by mouth daily.   Yes Historical Provider, MD    Family History    Family History  Problem Relation Age of Onset  . Other Father     died of suicide in setting of chronic pain.  . Other Mother     alive and well    Social History    Social History   Social History  . Marital Status: Widowed    Spouse Name: N/A  . Number of Children: N/A  . Years of Education: N/A   Occupational History  . Not on file.   Social History Main Topics  . Smoking status: Current Every Day Smoker -- 1.00 packs/day for 30 years    Types: Cigarettes  . Smokeless tobacco: Never Used  . Alcohol Use: 0.0 oz/week  0 Standard drinks or equivalent per week     Comment: 1 beer/day during the week.  Sometimes more on the weekends.  . Drug Use: No  . Sexual Activity: Not on file   Other Topics Concern  . Not on file   Social History Narrative   Lives in [place], [place].  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     Review of Systems    General:  No chills, fever, night sweats or weight changes.  Cardiovascular:  No chest pain, +++ dyspnea on exertion over the past few wks with acute dyspnea this AM.  No edema, orthopnea, palpitations, paroxysmal nocturnal dyspnea. Dermatological: No rash, lesions/masses Respiratory: No cough, +++ dyspnea this AM. Urologic: No hematuria, dysuria Abdominal:   No nausea, vomiting, diarrhea, bright red blood per rectum, melena, or hematemesis Neurologic:  No  visual changes, wkns, changes in mental status. All other systems reviewed and are otherwise negative except as noted above.  Physical Exam    Blood pressure 176/89, pulse 86, temperature 97.8 F (36.6 C), temperature source Rectal, resp. rate 17, SpO2 97 %.  General: Pleasant, NAD Psych: Normal affect. Neuro: Alert and oriented X 3. Moves all extremities spontaneously. HEENT: Normal  Neck: Supple without bruits.  JVP ~ 12 cm. Lungs:  Resp regular and unlabored, bibasilar crackles. Heart: RRR no s3, s4, or murmurs. Abdomen: Soft, non-tender, non-distended, BS + x 4.  Extremities: No clubbing, cyanosis or edema. DP/PT/Radials 2+ and equal bilaterally.  Labs    Troponin [facility] Test)  Recent Labs  08/12/15 0711  TROPIPOC 0.03    Lab Results  Component Value Date   WBC 15.2* 08/12/2015   HGB 13.6 08/12/2015   HCT 42.3 08/12/2015   MCV 95.9 08/12/2015   PLT 342 08/12/2015     Recent Labs Lab 08/12/15 0700  NA 141  K 4.2  CL 107  CO2 20*  BUN 9  CREATININE 1.06*  CALCIUM 8.9  GLUCOSE 192*    Lab Results  Component Value Date   DDIMER 2.82* 08/12/2015     Radiology Studies    Dg Chest Portable 1 View  08/12/2015  CLINICAL DATA:  Shortness of breath and cough for 2 weeks, history hypertension, smoking EXAM: PORTABLE CHEST 1 VIEW COMPARISON:  Portable exam 0716 hours ; prior studies from 2000 predate PACs and not available for comparison FINDINGS: Enlargement of cardiac silhouette. Mediastinal contours normal. Atelectasis versus scarring at minor fissure. Interstitial infiltrates in the mid to lower lungs bilaterally could reflect acute edema, atypical infection or fibrosis, stability uncertain in the absence of prior exams. No pleural effusion or pneumothorax. Bones unremarkable. IMPRESSION: Enlargement of cardiac silhouette. Interstitial infiltrates in the mid to lower lungs question edema, atypical infection or fibrosis. Electronically Signed   By: Ulyses Southward M.D.   On: 08/12/2015 07:38   ECG & Cardiac Imaging    Sinus tachycardia, 114, bi-atrial enlargement, LVH with repolarization abnormalities.  Assessment & Plan    1.  Hypertensive Urgency/Acute Pulmonary Edema:  Pt has a long h/o HTN.  She is not sure as to how well it is controlled @ home.  Over the past few wks, she has noted some DOE when walking.  No h/o chest pain, pnd, orthopnea, edema, or early satiety.  She became acutely dyspneic and hypoxic (77%) this AM and was found to be markedly hypertensive @ 230/116.  CXR shows pulmonary edema.  BNP elevated @  1043 while troponin normal.  WBC 15.2, D dimer also elevated @ 2.82.  No recent fevers, chills, productive cough.  BP now 170's and she is off of bipap after receiving IV lasix and nitropaste.  Agree with admission for diuresis and bp mgmt.  Check echo to eval LV fxn.  LVH noted on ECG with bi-atrial enlargement.  Add oral ACEI.  Consider further ischemic eval if she rules in and/or she is found to have LV dysfxn.  Defer PE w/u to medicine.  2.  Tobacco Abuse:  Complete cessation advised.  Signed, Nicolasa Ducking, NP 08/12/2015, 11:32 AM    I have examined the patient and reviewed assessment and plan and discussed with patient.  Agree with above as stated.  Await final echo result.  Diurese and increase BP meds.  Consider ischemia w/u based on LVEF.  I personally reviewed the CXR nad ECG.  No ischemic changes but LVH present.  Bilateral infiltrates noted on CXR. Likely edema.   Lance Muss

## 2015-08-12 NOTE — ED Notes (Signed)
Meal tray ordered 

## 2015-08-12 NOTE — ED Notes (Signed)
MD at bedside. 

## 2015-08-12 NOTE — ED Provider Notes (Addendum)
MSE was initiated and I personally evaluated the patient and placed orders (if any) at  6:57 AM on August 12, 2015.  The patient appears stable so that the remainder of the MSE may be completed by another provider.  Patient presents with shortness of breath by EMS. Acute onset of shortness of breath this morning after getting out of shower. She is a smoker but no history of CHF or COPD. O2 sats reported in the 70s by EMS. She was placed on BiPAP for work of breathing. They noted wheezing and she received a DuoNeb which she states made her feel better. Hypertensive and tachycardic in route. Denies chest pain. No leg swelling.  No wheezing on exam.  Rales, left lower lobe. Tachypnea and increased work of breathing. Patient was taken off CPAP.  Drop in O2 sats to 89%. Patient reports symptomatic improvement while on positive pressure and given work of breathing, she was placed on BiPAP again. Initial EKG is reassuring. Will obtain chest x-ray, ABG, basic labwork.  Signed out to oncoming physician.  Shon Baton, MD 08/12/15 0700   EKG Interpretation  Date/Time:  Wednesday August 12 2015 06:56:23 EDT Ventricular Rate:  114 PR Interval:  166 QRS Duration: 107 QT Interval:  312 QTC Calculation: 430 R Axis:   80 Text Interpretation:  Sinus tachycardia Biatrial enlargement LVH with secondary repolarization abnormality Confirmed by Wilkie Aye  MD, COURTNEY ([id number]) on 08/12/2015 7:00:08 AM        Shon Baton, MD 08/12/15 [phone]

## 2015-08-12 NOTE — ED Notes (Signed)
Patient arrived via EMS.  Got up to go to the bathroom and started having difficulty breathing.  Go into the shower think that would help her breathing but did not.  Initial sat 77% RA.  C PAP applied and sats 97%.  Initial BP 230/160 and last one 160/37, heart rate 119 and regular.  Administered Solumedrol 125mg  and Albuterol 10mg 

## 2015-08-12 NOTE — Progress Notes (Signed)
Preliminary results by tech - Venous Duplex Lower Ext. Completed. Negative for deep and superficial vein thrombosis in both legs.  Langston Summerfield, BS, RDMS, RVT  

## 2015-08-13 ENCOUNTER — Encounter (HOSPITAL_COMMUNITY): Payer: Self-pay | Admitting: Rehabilitation

## 2015-08-13 ENCOUNTER — Other Ambulatory Visit (HOSPITAL_COMMUNITY): Payer: 59

## 2015-08-13 ENCOUNTER — Inpatient Hospital Stay (HOSPITAL_BASED_OUTPATIENT_CLINIC_OR_DEPARTMENT_OTHER): Payer: 59

## 2015-08-13 DIAGNOSIS — I509 Heart failure, unspecified: Secondary | ICD-10-CM

## 2015-08-13 DIAGNOSIS — R61 Generalized hyperhidrosis: Secondary | ICD-10-CM

## 2015-08-13 DIAGNOSIS — J810 Acute pulmonary edema: Secondary | ICD-10-CM | POA: Diagnosis not present

## 2015-08-13 DIAGNOSIS — I160 Hypertensive urgency: Secondary | ICD-10-CM | POA: Diagnosis not present

## 2015-08-13 DIAGNOSIS — I10 Essential (primary) hypertension: Secondary | ICD-10-CM | POA: Diagnosis not present

## 2015-08-13 DIAGNOSIS — I5041 Acute combined systolic (congestive) and diastolic (congestive) heart failure: Secondary | ICD-10-CM | POA: Diagnosis not present

## 2015-08-13 DIAGNOSIS — R0600 Dyspnea, unspecified: Secondary | ICD-10-CM | POA: Diagnosis not present

## 2015-08-13 DIAGNOSIS — J449 Chronic obstructive pulmonary disease, unspecified: Secondary | ICD-10-CM | POA: Diagnosis not present

## 2015-08-13 DIAGNOSIS — I5042 Chronic combined systolic (congestive) and diastolic (congestive) heart failure: Secondary | ICD-10-CM

## 2015-08-13 LAB — ECHOCARDIOGRAM COMPLETE
CHL CUP MV DEC (S): 176
CHL CUP STROKE VOLUME: 45 mL
E decel time: 176 msec
E/e' ratio: 21.53
FS: 17 % — AB (ref 28–44)
IVS/LV PW RATIO, ED: 1
LA ID, A-P, ES: 47 mm
LA vol A4C: 77 ml
LA vol index: 39.7 mL/m2
LA vol: 71 mL
LADIAMINDEX: 2.62 cm/m2
LDCA: 3.46 cm2
LEFT ATRIUM END SYS DIAM: 47 mm
LV E/e'average: 21.53
LV TDI E'LATERAL: 4.06
LV TDI E'MEDIAL: 3.95
LV dias vol index: 80 mL/m2
LV dias vol: 143 mL — AB (ref 46–106)
LV e' LATERAL: 4.06 cm/s
LV sys vol: 98 mL — AB (ref 14–42)
LVEEMED: 21.53
LVOT diameter: 21 mm
LVSYSVOLIN: 55 mL/m2
MV Peak grad: 3 mmHg
MV VTI: 157 cm
MVPKAVEL: 89.3 m/s
MVPKEVEL: 87.4 m/s
PW: 11.2 mm — AB (ref 0.6–1.1)
Simpson's disk: 31

## 2015-08-13 LAB — BASIC METABOLIC PANEL
ANION GAP: 10 (ref 5–15)
BUN: 16 mg/dL (ref 6–20)
CHLORIDE: 101 mmol/L (ref 101–111)
CO2: 25 mmol/L (ref 22–32)
Calcium: 9.5 mg/dL (ref 8.9–10.3)
Creatinine, Ser: 0.98 mg/dL (ref 0.44–1.00)
Glucose, Bld: 122 mg/dL — ABNORMAL HIGH (ref 65–99)
POTASSIUM: 3.6 mmol/L (ref 3.5–5.1)
SODIUM: 136 mmol/L (ref 135–145)

## 2015-08-13 LAB — GLUCOSE, CAPILLARY
GLUCOSE-CAPILLARY: 116 mg/dL — AB (ref 65–99)
GLUCOSE-CAPILLARY: 108 mg/dL — AB (ref 65–99)
GLUCOSE-CAPILLARY: 128 mg/dL — AB (ref 65–99)
GLUCOSE-CAPILLARY: 117 mg/dL — AB (ref 65–99)

## 2015-08-13 LAB — HIV ANTIBODY (ROUTINE TESTING W REFLEX): HIV Screen 4th Generation wRfx: NONREACTIVE

## 2015-08-13 LAB — TROPONIN I: TROPONIN I: 0.05 ng/mL — AB (ref <0.031)

## 2015-08-13 LAB — HEMOGLOBIN A1C
Hgb A1c MFr Bld: 5 % (ref 4.8–5.6)
MEAN PLASMA GLUCOSE: 97 mg/dL

## 2015-08-13 MED ORDER — ASPIRIN 81 MG PO CHEW
81.0000 mg | CHEWABLE_TABLET | ORAL | Status: AC
  Administered 2015-08-14: 81 mg via ORAL
  Filled 2015-08-13: qty 1

## 2015-08-13 MED ORDER — HYDROCHLOROTHIAZIDE 25 MG PO TABS
25.0000 mg | ORAL_TABLET | Freq: Every day | ORAL | Status: DC
  Administered 2015-08-13 – 2015-08-14 (×2): 25 mg via ORAL
  Filled 2015-08-13 (×2): qty 1

## 2015-08-13 MED ORDER — SODIUM CHLORIDE 0.9 % IV SOLN
INTRAVENOUS | Status: DC
  Administered 2015-08-14: 06:00:00 via INTRAVENOUS

## 2015-08-13 MED ORDER — SODIUM CHLORIDE 0.9 % IV SOLN: 250.0000 mL | INTRAVENOUS | Status: DC | PRN

## 2015-08-13 MED ORDER — CARVEDILOL 3.125 MG PO TABS
3.1250 mg | ORAL_TABLET | Freq: Two times a day (BID) | ORAL | Status: DC
  Administered 2015-08-13 – 2015-08-14 (×2): 3.125 mg via ORAL
  Filled 2015-08-13 (×2): qty 1

## 2015-08-13 MED ORDER — SODIUM CHLORIDE 0.9% FLUSH: 3.0000 mL | INTRAVENOUS | Status: DC | PRN

## 2015-08-13 MED ORDER — LISINOPRIL 10 MG PO TABS
10.0000 mg | ORAL_TABLET | Freq: Every day | ORAL | Status: DC
  Administered 2015-08-13 – 2015-08-14 (×2): 10 mg via ORAL
  Filled 2015-08-13 (×2): qty 1

## 2015-08-13 MED ORDER — SODIUM CHLORIDE 0.9% FLUSH
3.0000 mL | Freq: Two times a day (BID) | INTRAVENOUS | Status: DC
  Administered 2015-08-13: 3 mL via INTRAVENOUS

## 2015-08-13 MED ORDER — ATORVASTATIN CALCIUM 40 MG PO TABS
40.0000 mg | ORAL_TABLET | Freq: Every day | ORAL | Status: DC
  Administered 2015-08-13: 40 mg via ORAL
  Filled 2015-08-13: qty 1

## 2015-08-13 NOTE — Progress Notes (Signed)
  Echocardiogram 2D Echocardiogram has been performed.  Leta Jungling M 08/13/2015, 11:51 AM

## 2015-08-13 NOTE — Progress Notes (Signed)
Hospital Problem List     Principal Problem:   Acute respiratory distress [facility]) Active Problems:   Hypertension    Patient Profile:   Primary Cardiologist: Dr. Eldridge Dace  55 y/o ? with a h/o HTN and tobacco abuse, who presented to the ED this AM with acute dyspnea, hypoxia, and marked hypertension.  Subjective   Reports her respiratory status has significantly improved. Denies any chest pain or palpitations.  Inpatient Medications    . atorvastatin  40 mg Oral q1800  . enoxaparin (LOVENOX) injection  40 mg Subcutaneous Q24H  . hydrochlorothiazide  25 mg Oral Daily  . ipratropium-albuterol  3 mL Nebulization TID  . lisinopril  10 mg Oral Daily  . sodium chloride flush  3 mL Intravenous Q12H    Vital Signs    Filed Vitals:   08/13/15 0637 08/13/15 0900 08/13/15 1035 08/13/15 1354  BP: 140/72  141/68 164/83  Pulse: 78 86 90 80  Temp: 98.3 F (36.8 C)   97.8 F (36.6 C)  TempSrc: Oral     Resp: Height:      Weight:      SpO2: 99% 99% 99% 98%    Intake/Output Summary (Last 24 hours) at 08/13/15 1658 Last data filed at 08/13/15 1345  Gross per 24 hour  Intake    723 ml  Output    900 ml  Net   -177 ml   Filed Weights   08/12/15 1825  Weight: 153 lb 12.8 oz (69.763 kg)    Physical Exam    General: Well developed, well nourished, female appearing in no acute distress. Head: Normocephalic, atraumatic.  Neck: Supple without bruits, JVD not elevated. Lungs:  Resp regular and unlabored, CTA without wheezing or rales. Heart: RRR, S1, S2, no S3, S4, or murmur; no rub. Abdomen: Soft, non-tender, non-distended with normoactive bowel sounds. No hepatomegaly. No rebound/guarding. No obvious abdominal masses. Extremities: No clubbing, cyanosis, or edema. Distal pedal pulses are 2+ bilaterally. Neuro: Alert and oriented X 3. Moves all extremities spontaneously. Psych: Normal affect.  Labs    CBC  Recent Labs  08/12/15 0700  WBC 15.2*  NEUTROABS  10.8*  HGB 13.6  HCT 42.3  MCV 95.9  PLT 342   Basic Metabolic Panel  Recent Labs  08/12/15 0700 08/13/15 0125  NA 141 136  K 4.2 3.6  CL 107 101  CO2 20* 25  GLUCOSE 192* 122*  BUN 9 16  CREATININE 1.06* 0.98  CALCIUM 8.9 9.5   Liver Function Tests No results for input(s): AST, ALT, ALKPHOS, BILITOT, PROT, ALBUMIN in the last 72 hours. No results for input(s): LIPASE, AMYLASE in the last 72 hours. Cardiac Enzymes  Recent Labs  08/12/15 1327 08/12/15 1900 08/13/15 0125  TROPONINI 0.06* 0.06* 0.05*   BNP Invalid input(s): POCBNP D-Dimer  Recent Labs  08/12/15 0730  DDIMER 2.82*   Hemoglobin A1C  Recent Labs  08/12/15 1900  HGBA1C 5.0   Fasting Lipid Panel  Recent Labs  08/12/15 1900  CHOL 247*  HDL 72  LDLCALC 160*  TRIG 76  CHOLHDL 3.4   Thyroid Function Tests No results for input(s): TSH, T4TOTAL, T3FREE, THYROIDAB in the last 72 hours.  Invalid input(s): FREET3   ECG    No new tracings.   Cardiac Studies and Radiology    Ct Angio Chest Pe W/cm &/or Wo Cm: 08/12/2015  CLINICAL DATA:  Exertional shortness of breath beginning today. EXAM: CT ANGIOGRAPHY CHEST WITH  CONTRAST TECHNIQUE: Multidetector CT imaging of the chest was performed using the standard protocol during bolus administration of intravenous contrast. Multiplanar CT image reconstructions and MIPs were obtained to evaluate the vascular anatomy. CONTRAST:  80 cc Isovue 370. COMPARISON:  Single-view of the chest earlier today. FINDINGS: The study is technically excellent. No pulmonary embolus is identified. Very small bilateral pleural effusions are seen. There is no pericardial effusion. Marked cardiomegaly is identified. A few atherosclerotic calcifications of the aorta are identified. No coronary artery calcifications are seen. There are no pathologically enlarged axillary, hilar or mediastinal lymphadenopathy. Small calcified precarinal nodes on the right are noted. The lungs  demonstrate centrilobular emphysema. Dependent atelectasis is seen. There is diffuse, mild ground-glass attenuation bilaterally. No nodule, mass or consolidative process is seen. Visualized upper abdomen shows reflux of contrast material into the inferior vena cava and hepatic veins consistent with right heart insufficiency. Imaged upper abdomen is otherwise unremarkable. No focal bony abnormality is identified. Review of the MIP images confirms the above findings. IMPRESSION: Negative for pulmonary embolus. Diffuse mild ground-glass attenuation bilaterally is likely due to mild interstitial edema given marked cardiomegaly, tiny bilateral pleural effusions and evidence of right heart insufficiency. Centrilobular emphysema. Electronically Signed   By: Drusilla Kanner M.D.   On: 08/12/2015 14:16   Dg Chest Portable 1 View: 08/12/2015  CLINICAL DATA:  Shortness of breath and cough for 2 weeks, history hypertension, smoking EXAM: PORTABLE CHEST 1 VIEW COMPARISON:  Portable exam 0716 hours ; prior studies from 2000 predate PACs and not available for comparison FINDINGS: Enlargement of cardiac silhouette. Mediastinal contours normal. Atelectasis versus scarring at minor fissure. Interstitial infiltrates in the mid to lower lungs bilaterally could reflect acute edema, atypical infection or fibrosis, stability uncertain in the absence of prior exams. No pleural effusion or pneumothorax. Bones unremarkable. IMPRESSION: Enlargement of cardiac silhouette. Interstitial infiltrates in the mid to lower lungs question edema, atypical infection or fibrosis. Electronically Signed   By: Ulyses Southward M.D.   On: 08/12/2015 07:38    Echocardiogram:  Study Conclusions  - Left ventricle: The cavity size was moderately dilated. There was  mild concentric hypertrophy. Systolic function was moderately  reduced. The estimated ejection fraction was in the range of 35%  to 40%. Moderate diffuse hypokinesis. Features are  consistent  with a pseudonormal left ventricular filling pattern, with  concomitant abnormal relaxation and increased filling pressure  (grade 2 diastolic dysfunction). Doppler parameters are  consistent with high ventricular filling pressure. - Aortic valve: There was trivial regurgitation. - Mitral valve: There was mild regurgitation. - Left atrium: The atrium was mildly dilated.  Assessment & Plan    1. Acute Combined Systolic and Diastolic CHF - presented with acute dyspnea. D-dimer elevated but CTA negative for PE. Received IV Lasix with complete resolution of her symptoms. Net output of -2.0L. - echo today showed a reduced EF of 35-40% with moderate diffuse hypokinesis. With a newly diagnosed cardiomyopathy, she will require an ischemic evaluation. Cardiac catheterization is recommended. - The patient understands that risks include but are not limited to stroke (1 in 1000), death (1 in 1000), kidney failure [usually temporary] (1 in 500), bleeding (1 in 200), allergic reaction [possibly serious] (1 in 200). She agrees to proceed. Has been added to the catheterization board for tomorrow afternoon. - continue BB and ACE-I. Consider switching HCTZ to Spironolactone prior to discharge.    2. Hypertensive Urgency - BP has improved, most recent readings with SBP's in the 140's -  160's. - started on Lisinopril 10mg  daily and HCTZ 25mg  daily.  - Will add low-dose Coreg with newly found cardiomyopathy.  3. Tobacco Abuse - cessation advised.  Signed, Ellsworth Lennox , PA-C 4:58 PM 08/13/2015 Pager: [phone]  I have examined the patient and reviewed assessment and plan and discussed with patient.  Agree with above as stated.  Agree with cath for ischemia eval.  She is able to lie flat after diuresis.  Lance Muss

## 2015-08-13 NOTE — Care Management Note (Signed)
Case Management Note  Patient Details  Name: CAROYL BYLER MRN: [id number] Date of Birth: [birth year]  Subjective/Objective:             Spoke with patient in room, she states that she lives at home, with her boyfriend, and also has her mom for support. 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 She denies needing assistance with medications. Patient drives and works outside the home.        Action/Plan:  No CM needs identified at this time.   Expected Discharge Date:                  Expected Discharge Plan:  Home/Self Care  In-House Referral:     Discharge planning Services  CM Consult  Post Acute Care Choice:  NA Choice offered to:     DME Arranged:    DME Agency:     HH Arranged:    HH Agency:     Status of Service:  In process, will continue to follow  Medicare Important Message Given:    Date Medicare IM Given:    Medicare IM give by:    Date Additional Medicare IM Given:    Additional Medicare Important Message give by:     If discussed at Long Length of Stay Meetings, dates discussed:    Additional Comments:  Lawerance Sabal, RN 08/13/2015, 12:58 PM

## 2015-08-13 NOTE — Progress Notes (Signed)
Subjective: Rachel Kane.  Patient feels much better, about at normal.  She had a cough for the first time last night productive of white sputum.  She has had night sweats for the last week, similar to when she went through menopause.  They have been coming and going since menopause. She denies fevers, chills, weight loss, or continued cough.  She has not had a colonoscopy.  Objective: Vital signs in last 24 hours: Filed Vitals:   08/12/15 1825 08/12/15 2058 08/12/15 2146 08/13/15 0637  BP: 164/94  145/65 140/72  Pulse: 95  80 78  Temp: 98.1 F (36.7 C)  99.1 F (37.3 C) 98.3 F (36.8 C)  TempSrc: Oral  Oral Oral  Resp: 19  16 17   Height: 5\' 4"  (1.626 m)     Weight: 153 lb 12.8 oz (69.763 kg)     SpO2: 100% 98% 98% 99%   Weight change:   Intake/Output Summary (Last 24 hours) at 08/13/15 0720 Last data filed at 08/12/15 2148  Gross per 24 hour  Intake      0 ml  Output   2025 ml  Net  -2025 ml   Physical Exam  Constitutional: She is oriented to person, place, and time and well-developed, well-nourished, and in no distress. No distress.  HENT:  Head: Normocephalic and atraumatic.  Eyes: EOM are normal. No scleral icterus.  Neck: No JVD present. No tracheal deviation present.  Cardiovascular: Normal rate, regular rhythm, normal heart sounds and intact distal pulses.   Pulmonary/Chest: Effort normal and breath sounds normal. No stridor. No respiratory distress. She has no wheezes. She has no rales.  No crackles appreciated.  Abdominal: Soft. Bowel sounds are normal. She exhibits no distension. There is no tenderness. There is no rebound and no guarding.  Musculoskeletal: She exhibits no edema.  Neurological: She is alert and oriented to person, place, and time.  Skin: Skin is warm and dry. She is not diaphoretic.    Lab Results: Basic Metabolic Panel:  Recent Labs Lab 08/12/15 0700 08/13/15 0125  NA 141 136  K 4.2 3.6  CL 107 101  CO2 20* 25  GLUCOSE 192* 122*  BUN 9  16  CREATININE 1.06* 0.98  CALCIUM 8.9 9.5   Liver Function Tests: No results for input(s): AST, ALT, ALKPHOS, BILITOT, PROT, ALBUMIN in the last 168 hours. No results for input(s): LIPASE, AMYLASE in the last 168 hours. No results for input(s): AMMONIA in the last 168 hours. CBC:  Recent Labs Lab 08/12/15 0700  WBC 15.2*  NEUTROABS 10.8*  HGB 13.6  HCT 42.3  MCV 95.9  PLT 342   Cardiac Enzymes:  Recent Labs Lab 08/12/15 1327 08/12/15 1900 08/13/15 0125  TROPONINI 0.06* 0.06* 0.05*   BNP: No results for input(s): PROBNP in the last 168 hours. D-Dimer:  Recent Labs Lab 08/12/15 0730  DDIMER 2.82*   CBG:  Recent Labs Lab 08/12/15 2050  GLUCAP 154*   Hemoglobin A1C:  Recent Labs Lab 08/12/15 1900  HGBA1C 5.0   Fasting Lipid Panel:  Recent Labs Lab 08/12/15 1900  CHOL 247*  HDL 72  LDLCALC 160*  TRIG 76  CHOLHDL 3.4   Thyroid Function Tests: No results for input(s): TSH, T4TOTAL, FREET4, T3FREE, THYROIDAB in the last 168 hours. Coagulation: No results for input(s): LABPROT, INR in the last 168 hours. Anemia Panel: No results for input(s): VITAMINB12, FOLATE, FERRITIN, TIBC, IRON, RETICCTPCT in the last 168 hours. Urine Drug Screen: Drugs of Abuse  Component Value Date/Time   LABOPIA POSITIVE* 08/12/2015 1005   COCAINSCRNUR NONE DETECTED 08/12/2015 1005   LABBENZ NONE DETECTED 08/12/2015 1005   AMPHETMU NONE DETECTED 08/12/2015 1005   THCU NONE DETECTED 08/12/2015 1005   LABBARB NONE DETECTED 08/12/2015 1005    Alcohol Level: No results for input(s): ETH in the last 168 hours. Urinalysis: No results for input(s): COLORURINE, LABSPEC, PHURINE, GLUCOSEU, HGBUR, BILIRUBINUR, KETONESUR, PROTEINUR, UROBILINOGEN, NITRITE, LEUKOCYTESUR in the last 168 hours.  Invalid input(s): APPERANCEUR Misc. Labs:   Micro Results: No results found for this or any previous visit (from the past 240 hour(s)). Studies/Results: Ct Angio Chest Pe W/cm  &/or Wo Cm  08/12/2015  CLINICAL DATA:  Exertional shortness of breath beginning today. EXAM: CT ANGIOGRAPHY CHEST WITH CONTRAST TECHNIQUE: Multidetector CT imaging of the chest was performed using the standard protocol during bolus administration of intravenous contrast. Multiplanar CT image reconstructions and MIPs were obtained to evaluate the vascular anatomy. CONTRAST:  80 cc Isovue 370. COMPARISON:  Single-view of the chest earlier today. FINDINGS: The study is technically excellent. No pulmonary embolus is identified. Very small bilateral pleural effusions are seen. There is no pericardial effusion. Marked cardiomegaly is identified. A few atherosclerotic calcifications of the aorta are identified. No coronary artery calcifications are seen. There are no pathologically enlarged axillary, hilar or mediastinal lymphadenopathy. Small calcified precarinal nodes on the right are noted. The lungs demonstrate centrilobular emphysema. Dependent atelectasis is seen. There is diffuse, mild ground-glass attenuation bilaterally. No nodule, mass or consolidative process is seen. Visualized upper abdomen shows reflux of contrast material into the inferior vena cava and hepatic veins consistent with right heart insufficiency. Imaged upper abdomen is otherwise unremarkable. No focal bony abnormality is identified. Review of the MIP images confirms the above findings. IMPRESSION: Negative for pulmonary embolus. Diffuse mild ground-glass attenuation bilaterally is likely due to mild interstitial edema given marked cardiomegaly, tiny bilateral pleural effusions and evidence of right heart insufficiency. Centrilobular emphysema. Electronically Signed   By: Drusilla Kanner M.D.   On: 08/12/2015 14:16   Dg Chest Portable 1 View  08/12/2015  CLINICAL DATA:  Shortness of breath and cough for 2 weeks, history hypertension, smoking EXAM: PORTABLE CHEST 1 VIEW COMPARISON:  Portable exam 0716 hours ; prior studies from 2000 predate  PACs and not available for comparison FINDINGS: Enlargement of cardiac silhouette. Mediastinal contours normal. Atelectasis versus scarring at minor fissure. Interstitial infiltrates in the mid to lower lungs bilaterally could reflect acute edema, atypical infection or fibrosis, stability uncertain in the absence of prior exams. No pleural effusion or pneumothorax. Bones unremarkable. IMPRESSION: Enlargement of cardiac silhouette. Interstitial infiltrates in the mid to lower lungs question edema, atypical infection or fibrosis. Electronically Signed   By: Ulyses Southward M.D.   On: 08/12/2015 07:38   Medications: I have reviewed the patient's current medications. Scheduled Meds: . atorvastatin  40 mg Oral q1800  . enoxaparin (LOVENOX) injection  40 mg Subcutaneous Q24H  . ipratropium-albuterol  3 mL Nebulization TID  . sodium chloride flush  3 mL Intravenous Q12H   Continuous Infusions:  PRN Meds:.acetaminophen **OR** acetaminophen, ipratropium-albuterol, labetalol, senna-docusate Assessment/Plan: Principal Problem:   Acute respiratory distress (HCC) Active Problems:   Hypertension  Acute Pulmonary Edema 2/2 Hypertensive Emergency: Patient presents with acute hypoxia requiring Solumedrol, and BiPAP.  Patient's breathing improved after Duoneb and Lasix IV. D-dimer was elevated, but LE dopplers and CTA chest negative for DVT and PE.  Troponins negative.  CXR clear.  Patient comfortable this  morning and lungs clear, so will continue to titrate patient's BP medications and follow up results of echo.  Echo - Lisinopril 10 mg - HCTZ 25 mg - Teley  COPD: Patient has 20 pack year smoking history and CT chest demonstrated centrilobular emphysema.  Patient would likely benefit from PFTs as outpatient for staging and medication optimization. - Duonebs PRN - PFTs as outpatient  Night Sweats: Patient reports recurrent soaking night sweats without fevers, chills, weight loss, or palpable lymph node.   Symptoms consistent with her previous menopausal sweats.  She has not had a colonoscopy. Patient would benefit from screening colonoscopy as an outpatient. - Colonoscopy as outpatient  FEN/GI: - HH  DVT Ppx: Lovenox  Dispo: Disposition is deferred at this time, awaiting improvement of current medical problems.  Anticipated discharge in approximately 1 day(s).   The patient does not have a current PCP (No Pcp Per Patient) and does need an [facility] hospital follow-up appointment after discharge.  The patient does not have transportation limitations that hinder transportation to clinic appointments.  .Services Needed at time of discharge: Y = Yes, Blank = No PT:   OT:   RN:   Equipment:   Other:     LOS: 1 day   Jana Half, MD 08/13/2015, 7:20 AM

## 2015-08-14 ENCOUNTER — Encounter (HOSPITAL_COMMUNITY)
Admission: EM | Disposition: A | Discharge status: Home / Self Care | Payer: Self-pay | Source: Home / Self Care | Attending: Emergency Medicine

## 2015-08-14 ENCOUNTER — Encounter (HOSPITAL_COMMUNITY): Payer: Self-pay | Admitting: Interventional Cardiology

## 2015-08-14 DIAGNOSIS — I5041 Acute combined systolic (congestive) and diastolic (congestive) heart failure: Secondary | ICD-10-CM

## 2015-08-14 DIAGNOSIS — J449 Chronic obstructive pulmonary disease, unspecified: Secondary | ICD-10-CM

## 2015-08-14 DIAGNOSIS — Z720 Tobacco use: Secondary | ICD-10-CM | POA: Diagnosis not present

## 2015-08-14 DIAGNOSIS — I428 Other cardiomyopathies: Secondary | ICD-10-CM

## 2015-08-14 DIAGNOSIS — I10 Essential (primary) hypertension: Secondary | ICD-10-CM | POA: Diagnosis not present

## 2015-08-14 DIAGNOSIS — R0600 Dyspnea, unspecified: Secondary | ICD-10-CM | POA: Diagnosis not present

## 2015-08-14 DIAGNOSIS — I429 Cardiomyopathy, unspecified: Secondary | ICD-10-CM

## 2015-08-14 HISTORY — PX: CARDIAC CATHETERIZATION: SHX172

## 2015-08-14 LAB — BASIC METABOLIC PANEL
ANION GAP: 9 (ref 5–15)
BUN: 15 mg/dL (ref 6–20)
CALCIUM: 9.6 mg/dL (ref 8.9–10.3)
CO2: 29 mmol/L (ref 22–32)
Chloride: 103 mmol/L (ref 101–111)
Creatinine, Ser: 0.96 mg/dL (ref 0.44–1.00)
Glucose, Bld: 99 mg/dL (ref 65–99)
Potassium: 4.5 mmol/L (ref 3.5–5.1)
Sodium: 141 mmol/L (ref 135–145)

## 2015-08-14 LAB — POCT I-STAT 3, ART BLOOD GAS (G3+)
Acid-Base Excess: 1 mmol/L (ref 0.0–2.0)
BICARBONATE: 26.5 meq/L — AB (ref 20.0–24.0)
O2 Saturation: 98 %
PH ART: 7.403 (ref 7.350–7.450)
TCO2: 28 mmol/L (ref 0–100)
pCO2 arterial: 42.6 mmHg (ref 35.0–45.0)
pO2, Arterial: 100 mmHg (ref 80.0–100.0)

## 2015-08-14 LAB — GLUCOSE, CAPILLARY
GLUCOSE-CAPILLARY: 116 mg/dL — AB (ref 65–99)
Glucose-Capillary: 96 mg/dL (ref 65–99)

## 2015-08-14 LAB — IRON AND TIBC
Iron: 70 ug/dL (ref 28–170)
Saturation Ratios: 18 % (ref 10.4–31.8)
TIBC: 382 ug/dL (ref 250–450)
UIBC: 312 ug/dL

## 2015-08-14 LAB — POCT I-STAT 3, VENOUS BLOOD GAS (G3P V)
ACID-BASE EXCESS: 1 mmol/L (ref 0.0–2.0)
Bicarbonate: 26.4 mEq/L — ABNORMAL HIGH (ref 20.0–24.0)
O2 SAT: 66 %
PCO2 VEN: 44.3 mmHg — AB (ref 45.0–50.0)
TCO2: 28 mmol/L (ref 0–100)
pH, Ven: 7.383 — ABNORMAL HIGH (ref 7.250–7.300)
pO2, Ven: 35 mmHg (ref 31.0–45.0)

## 2015-08-14 LAB — TSH: TSH: 0.99 u[IU]/mL (ref 0.350–4.500)

## 2015-08-14 LAB — PROTIME-INR
INR: 1.04 (ref 0.00–1.49)
PROTHROMBIN TIME: 13.8 s (ref 11.6–15.2)

## 2015-08-14 SURGERY — RIGHT/LEFT HEART CATH AND CORONARY ANGIOGRAPHY

## 2015-08-14 MED ORDER — OXYCODONE-ACETAMINOPHEN 5-325 MG PO TABS: 1.0000 | ORAL_TABLET | ORAL | Status: DC | PRN

## 2015-08-14 MED ORDER — FUROSEMIDE 40 MG PO TABS
40.0000 mg | ORAL_TABLET | Freq: Every day | ORAL | Status: DC
  Administered 2015-08-14: 40 mg via ORAL
  Filled 2015-08-14: qty 1

## 2015-08-14 MED ORDER — IOPAMIDOL (ISOVUE-370) INJECTION 76%
INTRAVENOUS | Status: AC
  Filled 2015-08-14: qty 100

## 2015-08-14 MED ORDER — LIDOCAINE HCL (PF) 1 % IJ SOLN
INTRAMUSCULAR | Status: DC | PRN
  Administered 2015-08-14: 20 mL via SUBCUTANEOUS

## 2015-08-14 MED ORDER — HEPARIN (PORCINE) IN NACL 2-0.9 UNIT/ML-% IJ SOLN
INTRAMUSCULAR | Status: AC
  Filled 2015-08-14: qty 500

## 2015-08-14 MED ORDER — SODIUM CHLORIDE 0.9% FLUSH: 3.0000 mL | INTRAVENOUS | Status: DC | PRN

## 2015-08-14 MED ORDER — ACETAMINOPHEN 325 MG PO TABS: 650.0000 mg | ORAL_TABLET | ORAL | Status: DC | PRN

## 2015-08-14 MED ORDER — LISINOPRIL 10 MG PO TABS: 10.0000 mg | ORAL_TABLET | Freq: Every day | ORAL | Status: DC

## 2015-08-14 MED ORDER — SODIUM CHLORIDE 0.9 % WEIGHT BASED INFUSION: 3.0000 mL/kg/h | INTRAVENOUS | Status: DC

## 2015-08-14 MED ORDER — MIDAZOLAM HCL 2 MG/2ML IJ SOLN
INTRAMUSCULAR | Status: DC | PRN
  Administered 2015-08-14 (×3): 1 mg via INTRAVENOUS

## 2015-08-14 MED ORDER — SPIRONOLACTONE 25 MG PO TABS: 12.5000 mg | ORAL_TABLET | Freq: Every day | ORAL | Status: DC

## 2015-08-14 MED ORDER — MIDAZOLAM HCL 2 MG/2ML IJ SOLN
INTRAMUSCULAR | Status: AC
  Filled 2015-08-14: qty 2

## 2015-08-14 MED ORDER — IOPAMIDOL (ISOVUE-370) INJECTION 76%
INTRAVENOUS | Status: DC | PRN
  Administered 2015-08-14: 50 mL via INTRA_ARTERIAL

## 2015-08-14 MED ORDER — FUROSEMIDE 40 MG PO TABS: 40.0000 mg | ORAL_TABLET | Freq: Every day | ORAL | Status: DC

## 2015-08-14 MED ORDER — ONDANSETRON HCL 4 MG/2ML IJ SOLN: 4.0000 mg | Freq: Four times a day (QID) | INTRAMUSCULAR | Status: DC | PRN

## 2015-08-14 MED ORDER — SODIUM CHLORIDE 0.9 % IV SOLN: 250.0000 mL | INTRAVENOUS | Status: DC | PRN

## 2015-08-14 MED ORDER — LIDOCAINE HCL (PF) 1 % IJ SOLN
INTRAMUSCULAR | Status: AC
  Filled 2015-08-14: qty 30

## 2015-08-14 MED ORDER — SODIUM CHLORIDE 0.9% FLUSH
3.0000 mL | Freq: Two times a day (BID) | INTRAVENOUS | Status: DC
  Administered 2015-08-14: 3 mL via INTRAVENOUS

## 2015-08-14 MED ORDER — ALBUTEROL SULFATE HFA 108 (90 BASE) MCG/ACT IN AERS: 2.0000 | INHALATION_SPRAY | Freq: Four times a day (QID) | RESPIRATORY_TRACT | Status: DC | PRN

## 2015-08-14 MED ORDER — FENTANYL CITRATE (PF) 100 MCG/2ML IJ SOLN
INTRAMUSCULAR | Status: DC | PRN
  Administered 2015-08-14: 50 ug via INTRAVENOUS

## 2015-08-14 MED ORDER — HEPARIN (PORCINE) IN NACL 2-0.9 UNIT/ML-% IJ SOLN
INTRAMUSCULAR | Status: DC | PRN
  Administered 2015-08-14: 1500 mL

## 2015-08-14 MED ORDER — FENTANYL CITRATE (PF) 100 MCG/2ML IJ SOLN
INTRAMUSCULAR | Status: AC
  Filled 2015-08-14: qty 2

## 2015-08-14 MED ORDER — ATORVASTATIN CALCIUM 40 MG PO TABS: 40.0000 mg | ORAL_TABLET | Freq: Every day | ORAL | Status: DC

## 2015-08-14 MED ORDER — CARVEDILOL 3.125 MG PO TABS: 3.1250 mg | ORAL_TABLET | Freq: Two times a day (BID) | ORAL | Status: DC

## 2015-08-14 SURGICAL SUPPLY — 12 items
CATH SITESEER 5F MULTI A 2 (CATHETERS) ×3 IMPLANT
CATH SWAN GANZ 7F STRAIGHT (CATHETERS) ×3 IMPLANT
GLIDESHEATH SLEND A-KIT 6F 22G (SHEATH) IMPLANT
KIT HEART LEFT (KITS) ×3 IMPLANT
KIT HEART RIGHT NAMIC (KITS) ×3 IMPLANT
PACK CARDIAC CATHETERIZATION (CUSTOM PROCEDURE TRAY) ×3 IMPLANT
SHEATH PINNACLE 5F 10CM (SHEATH) ×3 IMPLANT
SHEATH PINNACLE 7F 10CM (SHEATH) ×3 IMPLANT
TRANSDUCER W/STOPCOCK (MISCELLANEOUS) ×3 IMPLANT
TUBING CIL FLEX 10 FLL-RA (TUBING) ×3 IMPLANT
WIRE EMERALD 3MM-J .035X150CM (WIRE) ×3 IMPLANT
WIRE SAFE-T 1.5MM-J .035X260CM (WIRE) IMPLANT

## 2015-08-14 NOTE — Discharge Summary (Signed)
Name: Rachel Kane MRN: [id number] DOB: 1961-02-21 55 y.o. PCP: No Pcp Per Patient  Date of Admission: 08/12/2015  6:49 AM Date of Discharge: 08/14/2015 Attending Physician: Levert Feinstein, MD  Discharge Diagnosis: 1. Acute HFrEF 2. COPD   Principal Problem:   Acute combined systolic and diastolic heart failure (HCC) Active Problems:   Essential hypertension   Nonischemic cardiomyopathy (HCC)  Discharge Medications:   Medication List    STOP taking these medications        hydrochlorothiazide 25 MG tablet  Commonly known as:  HYDRODIURIL      TAKE these medications        albuterol 108 (90 Base) MCG/ACT inhaler  Commonly known as:  PROVENTIL HFA;VENTOLIN HFA  Inhale 2 puffs into the lungs every 6 (six) hours as needed for wheezing or shortness of breath.     atorvastatin 40 MG tablet  Commonly known as:  LIPITOR  Take 1 tablet (40 mg total) by mouth daily at 6 PM.     carvedilol 3.125 MG tablet  Commonly known as:  COREG  Take 1 tablet (3.125 mg total) by mouth 2 (two) times daily with a meal.     furosemide 40 MG tablet  Commonly known as:  LASIX  Take 1 tablet (40 mg total) by mouth daily.     lisinopril 10 MG tablet  Commonly known as:  PRINIVIL,ZESTRIL  Take 1 tablet (10 mg total) by mouth daily.     loratadine 10 MG tablet  Commonly known as:  CLARITIN  Take 10 mg by mouth daily.        Disposition and follow-up:   Rachel Kane was discharged from [facility] in Good condition.  At the hospital follow up visit please address:  1.  Chest pain and dyspnea, medication adherence, cardiology follow up, need for colonoscopy and PFTs, smoking cessation  2.  Labs / imaging needed at time of follow-up: BMP, PFTs, Colonoscopy  3.  Pending labs/ test needing follow-up: none  Follow-up Appointments: Follow-up Information    Follow up with Robbie Lis, PA-C On 08/31/2015.   Specialties:  Cardiology, Radiology   Why:   Cardiology Hospital Follow-Up on 08/31/2015 at 8:30AM.   Contact information:   [address] [place] [address] [phone]       Follow up with Laban Emperor, MD On 08/26/2015.   Specialty:  Internal Medicine   Why:  3:15p   Contact information:   [address] [place] [address] [phone]       Discharge Instructions: Discharge Instructions    Call MD for:  difficulty breathing, headache or visual disturbances    Complete by:  As directed      Call MD for:  extreme fatigue    Complete by:  As directed      Call MD for:  persistant dizziness or light-headedness    Complete by:  As directed      Call MD for:  redness, tenderness, or signs of infection (pain, swelling, redness, odor or green/yellow discharge around incision site)    Complete by:  As directed      Call MD for:  severe uncontrolled pain    Complete by:  As directed      Diet - low sodium heart healthy    Complete by:  As directed      Increase activity slowly    Complete by:  As directed  Consultations: Treatment Team:  Rounding Lbcardiology, MD  Procedures Performed:  Ct Angio Chest Pe W/cm &/or Wo Cm  08/12/2015  CLINICAL DATA:  Exertional shortness of breath beginning today. EXAM: CT ANGIOGRAPHY CHEST WITH CONTRAST TECHNIQUE: Multidetector CT imaging of the chest was performed using the standard protocol during bolus administration of intravenous contrast. Multiplanar CT image reconstructions and MIPs were obtained to evaluate the vascular anatomy. CONTRAST:  80 cc Isovue 370. COMPARISON:  Single-view of the chest earlier today. FINDINGS: The study is technically excellent. No pulmonary embolus is identified. Very small bilateral pleural effusions are seen. There is no pericardial effusion. Marked cardiomegaly is identified. A few atherosclerotic calcifications of the aorta are identified. No coronary artery calcifications are seen. There are no pathologically enlarged  axillary, hilar or mediastinal lymphadenopathy. Small calcified precarinal nodes on the right are noted. The lungs demonstrate centrilobular emphysema. Dependent atelectasis is seen. There is diffuse, mild ground-glass attenuation bilaterally. No nodule, mass or consolidative process is seen. Visualized upper abdomen shows reflux of contrast material into the inferior vena cava and hepatic veins consistent with right heart insufficiency. Imaged upper abdomen is otherwise unremarkable. No focal bony abnormality is identified. Review of the MIP images confirms the above findings. IMPRESSION: Negative for pulmonary embolus. Diffuse mild ground-glass attenuation bilaterally is likely due to mild interstitial edema given marked cardiomegaly, tiny bilateral pleural effusions and evidence of right heart insufficiency. Centrilobular emphysema. Electronically Signed   By: Drusilla Kanner M.D.   On: 08/12/2015 14:16   Dg Chest Portable 1 View  08/12/2015  CLINICAL DATA:  Shortness of breath and cough for 2 weeks, history hypertension, smoking EXAM: PORTABLE CHEST 1 VIEW COMPARISON:  Portable exam 0716 hours ; prior studies from 2000 predate PACs and not available for comparison FINDINGS: Enlargement of cardiac silhouette. Mediastinal contours normal. Atelectasis versus scarring at minor fissure. Interstitial infiltrates in the mid to lower lungs bilaterally could reflect acute edema, atypical infection or fibrosis, stability uncertain in the absence of prior exams. No pleural effusion or pneumothorax. Bones unremarkable. IMPRESSION: Enlargement of cardiac silhouette. Interstitial infiltrates in the mid to lower lungs question edema, atypical infection or fibrosis. Electronically Signed   By: Ulyses Southward M.D.   On: 08/12/2015 07:38    2D Echo: Study Conclusions  - Left ventricle: The cavity size was moderately dilated. There was  mild concentric hypertrophy. Systolic function was moderately  reduced. The estimated  ejection fraction was in the range of 35%  to 40%. Moderate diffuse hypokinesis. Features are consistent  with a pseudonormal left ventricular filling pattern, with  concomitant abnormal relaxation and increased filling pressure  (grade 2 diastolic dysfunction). Doppler parameters are  consistent with high ventricular filling pressure. - Aortic valve: There was trivial regurgitation. - Mitral valve: There was mild regurgitation. - Left atrium: The atrium was mildly dilated.  Cardiac Cath:   Widely patent coronary arteries. Minimal luminal irregularities are noted in the LAD.  Significant left ventricular systolic dysfunction with estimated ejection fraction 25-30%.  Normal right heart pressures and left ventricular filling pressures with pulmonary wedge mean, 12 mmHg. Left ventricular end-diastolic pressure 8 mmHg.  Admission HPI: Rachel Kane is a 55 y.o. female with a PMH of HTN, pneumonia, and bronchitis presents to the ED with acute onset of dyspnea this AM. She reports waking up around 4AM to urinate and tried to go back to sleep and woke up around 5AM with shortness of breath. She endorses some diaphoresis with this episode and chronic nonproductive  cough. She denies associated wheezing, chest pain, palpitations, fever/chills, hemoptysis, N/V, decreased appetite. Denies orthopnea, dyspnea with exertion, LE swelling, history of VTE. She's had bronchitis in the past but never "anything like this." She smokes about 1ppd for the past 20+ years, drinks about 2-3 [employer] hard lemonade. She denies any recreational drug use. 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 She denies chronic dyspnea with exertion. There is a FH of VTE in her mother that was attributed to OCPs. She reports having a "charlie horse" in her RLE about 2 weeks ago. Denies any recent long distance travel or recent surgeries or injuries. No history of CHF or COPD. States she did have an inhaler  when she had bronchitis. She does not go to the doctor on a regular basis but has been in the [facility] system. She takes hydrochlorothiazide for blood pressure and uses claritin occasionally for allergies.   Prior to arrival in the ED, O2 sat 77% per EMS. BP initially was 230/160 with HR 119. She was started on BIPAP, given solumedrol and albuterol with improvement of O2 sats. 1V CXR showed enlargement of the cardiac silhouette with interstitial infiltrates in the mid/lower lungs with questionable edema, atypical infection, or fibrosis. EKG with sinus tachycardia. Labs remarkable for an elevated d-dimer 2.82, BNP 1043, leukocytosis of 15.2, and SCr of 1.06. ABG unremarkable. She was given 40mg  lasix, ASA, and NTG in the ED. She is now on nasal canula.   Hospital Course by problem list: Principal Problem:   Acute combined systolic and diastolic heart failure (HCC) Active Problems:   Essential hypertension   Nonischemic cardiomyopathy (HCC)   Acute HFrEF 2/2 HTN: Patient presented with acute respiratory distress with SpO2 77% in the ED requiring BiPAP. She was given Solumedrol, Lasix IV, and Duonebs.  D-dimer was elevated, but lower extremity dopplers and CTA chest were negative for PE.  Troponins were minimally elevated to 0.06. BNP was elevated to 1043.  She had an sBP > 200 in the ED, and her acute presentation was believed to be 2/2 flash pulmonary edema from hypertensive emergency.  Her breathing markedly improved and patient was saturating 99% on RA the following morning.  Echo demonstrated EF 25-30% and grade 2 diastolic dysfunction.  Cardiac cath demonstrated no appreciable CAD, but reduced EF and diffuse hypokinesis.  She was started on Carvedilol 3.125 mg BID, Lisinopril 10 mg, Lasix 40 mg daily, and Atorvastatin 40 mg daily.  Centrilobular Emphysema: Patient has a 20 pack year smoking history.  CTA chest demonstrated centrilobular emphysema.  Patient understands the need for  smoking cessation and has agreed to stop.  She was discharged with Albuterol inhaler.  At follow up, please arranged PFTs for staging of COPD.  Colonoscopy: Patient reported night sweats similar to her previous menopausal symptoms. She denied fever, chronic cough, or weight loss.  She has not had a colonoscopy.  Please arrange at follow up.  Discharge Vitals:   BP 164/93 mmHg  Pulse 80  Temp(Src) 97.9 F (36.6 C) (Oral)  Resp 18  Ht 5\' 4"  (1.626 m)  Wt 152 lb 8.9 oz (69.2 kg)  BMI 26.17 kg/m2  SpO2 93%  Discharge Labs:  Results for orders placed or performed during the hospital encounter of 08/12/15 (from the past 24 hour(s))  Glucose, capillary     Status: Abnormal   Collection Time: 08/13/15 12:08 PM  Result Value Ref Range   Glucose-Capillary 108 (H) 65 - 99 mg/dL  Glucose, capillary  Status: Abnormal   Collection Time: 08/13/15  5:37 PM  Result Value Ref Range   Glucose-Capillary 128 (H) 65 - 99 mg/dL  Glucose, capillary     Status: Abnormal   Collection Time: 08/13/15  9:18 PM  Result Value Ref Range   Glucose-Capillary 117 (H) 65 - 99 mg/dL  Basic metabolic panel     Status: None   Collection Time: 08/14/15  5:35 AM  Result Value Ref Range   Sodium 141 135 - 145 mmol/L   Potassium 4.5 3.5 - 5.1 mmol/L   Chloride 103 101 - 111 mmol/L   CO2 29 22 - 32 mmol/L   Glucose, Bld 99 65 - 99 mg/dL   BUN 15 6 - 20 mg/dL   Creatinine, Ser 3.61 0.44 - 1.00 mg/dL   Calcium 9.6 8.9 - 22.4 mg/dL   GFR calc non Af Amer >60 >60 mL/min   GFR calc Af Amer >60 >60 mL/min   Anion gap 9 5 - 15  Protime-INR     Status: None   Collection Time: 08/14/15  5:35 AM  Result Value Ref Range   Prothrombin Time 13.8 11.6 - 15.2 seconds   INR 1.04 0.00 - 1.49  I-STAT 3, venous blood gas (G3P V)     Status: Abnormal   Collection Time: 08/14/15  7:56 AM  Result Value Ref Range   pH, Ven 7.383 (H) 7.250 - 7.300   pCO2, Ven 44.3 (L) 45.0 - 50.0 mmHg   pO2, Ven 35.0 31.0 - 45.0 mmHg    Bicarbonate 26.4 (H) 20.0 - 24.0 mEq/L   TCO2 28 0 - 100 mmol/L   O2 Saturation 66.0 %   Acid-Base Excess 1.0 0.0 - 2.0 mmol/L   Patient temperature HIDE    Sample type VENOUS   I-STAT 3, arterial blood gas (G3+)     Status: Abnormal   Collection Time: 08/14/15  7:56 AM  Result Value Ref Range   pH, Arterial 7.403 7.350 - 7.450   pCO2 arterial 42.6 35.0 - 45.0 mmHg   pO2, Arterial 100.0 80.0 - 100.0 mmHg   Bicarbonate 26.5 (H) 20.0 - 24.0 mEq/L   TCO2 28 0 - 100 mmol/L   O2 Saturation 98.0 %   Acid-Base Excess 1.0 0.0 - 2.0 mmol/L   Patient temperature HIDE    Sample type ARTERIAL   Glucose, capillary     Status: Abnormal   Collection Time: 08/14/15  9:08 AM  Result Value Ref Range   Glucose-Capillary 116 (H) 65 - 99 mg/dL   Comment 1 Notify RN     Signed: Jana Half, MD 08/14/2015, 11:18 AM    Services Ordered on Discharge: none Equipment Ordered on Discharge: none

## 2015-08-14 NOTE — Progress Notes (Signed)
Subjective: NAEON.  Echo revealed EF 25% and patient underwent cardiac catheterization this morning without complication. She denies chest pain, SOB, or orthopnea.   Objective: Vital signs in last 24 hours: Filed Vitals:   08/13/15 1354 08/13/15 1959 08/13/15 2120 08/14/15 0532  BP: 164/83  147/78 148/68  Pulse: 80  59 58  Temp: 97.8 F (36.6 C)  98.1 F (36.7 C) 97.8 F (36.6 C)  TempSrc:      Resp: Height:      Weight:    152 lb 8.9 oz (69.2 kg)  SpO2: 98% 98% 93% 96%   Weight change: -1 lb 3.9 oz (-0.563 kg)  Intake/Output Summary (Last 24 hours) at 08/14/15 0655 Last data filed at 08/13/15 1345  Gross per 24 hour  Intake    723 ml  Output    600 ml  Net    123 ml   Physical Exam  Constitutional: She is oriented to person, place, and time and well-developed, well-nourished, and in no distress. No distress.  HENT:  Head: Normocephalic and atraumatic.  Eyes: EOM are normal. No scleral icterus.  Neck: No JVD present. No tracheal deviation present.  Cardiovascular: Normal rate, regular rhythm, normal heart sounds and intact distal pulses.   Pulmonary/Chest: Effort normal and breath sounds normal. No stridor. No respiratory distress. She has no wheezes. She has no rales.  No crackles appreciated.  Abdominal: Soft. Bowel sounds are normal. She exhibits no distension. There is no tenderness. There is no rebound and no guarding.  Right groin puncture bandaged without signs of bleeding.  Musculoskeletal: She exhibits no edema.  Neurological: She is alert and oriented to person, place, and time.  Skin: Skin is warm and dry. She is not diaphoretic.    Lab Results: Basic Metabolic Panel:  Recent Labs Lab 08/13/15 0125 08/14/15 0535  NA 136 141  K 3.6 4.5  CL 101 103  CO2 25 29  GLUCOSE 122* 99  BUN 16 15  CREATININE 0.98 0.96  CALCIUM 9.5 9.6   Liver Function Tests: No results for input(s): AST, ALT, ALKPHOS, BILITOT, PROT, ALBUMIN in the last 168  hours. No results for input(s): LIPASE, AMYLASE in the last 168 hours. No results for input(s): AMMONIA in the last 168 hours. CBC:  Recent Labs Lab 08/12/15 0700  WBC 15.2*  NEUTROABS 10.8*  HGB 13.6  HCT 42.3  MCV 95.9  PLT 342   Cardiac Enzymes:  Recent Labs Lab 08/12/15 1327 08/12/15 1900 08/13/15 0125  TROPONINI 0.06* 0.06* 0.05*   BNP: No results for input(s): PROBNP in the last 168 hours. D-Dimer:  Recent Labs Lab 08/12/15 0730  DDIMER 2.82*   CBG:  Recent Labs Lab 08/12/15 2050 08/13/15 0749 08/13/15 1208 08/13/15 1737 08/13/15 2118  GLUCAP 154* 116* 108* 128* 117*   Hemoglobin A1C:  Recent Labs Lab 08/12/15 1900  HGBA1C 5.0   Fasting Lipid Panel:  Recent Labs Lab 08/12/15 1900  CHOL 247*  HDL 72  LDLCALC 160*  TRIG 76  CHOLHDL 3.4   Thyroid Function Tests: No results for input(s): TSH, T4TOTAL, FREET4, T3FREE, THYROIDAB in the last 168 hours. Coagulation:  Recent Labs Lab 08/14/15 0535  LABPROT 13.8  INR 1.04   Anemia Panel: No results for input(s): VITAMINB12, FOLATE, FERRITIN, TIBC, IRON, RETICCTPCT in the last 168 hours. Urine Drug Screen: Drugs of Abuse     Component Value Date/Time   LABOPIA POSITIVE* 08/12/2015 1005   COCAINSCRNUR NONE DETECTED 08/12/2015  1005   LABBENZ NONE DETECTED 08/12/2015 1005   AMPHETMU NONE DETECTED 08/12/2015 1005   THCU NONE DETECTED 08/12/2015 1005   LABBARB NONE DETECTED 08/12/2015 1005    Alcohol Level: No results for input(s): ETH in the last 168 hours. Urinalysis: No results for input(s): COLORURINE, LABSPEC, PHURINE, GLUCOSEU, HGBUR, BILIRUBINUR, KETONESUR, PROTEINUR, UROBILINOGEN, NITRITE, LEUKOCYTESUR in the last 168 hours.  Invalid input(s): APPERANCEUR Misc. Labs:   Micro Results: No results found for this or any previous visit (from the past 240 hour(s)). Studies/Results: Ct Angio Chest Pe W/cm &/or Wo Cm  08/12/2015  CLINICAL DATA:  Exertional shortness of breath  beginning today. EXAM: CT ANGIOGRAPHY CHEST WITH CONTRAST TECHNIQUE: Multidetector CT imaging of the chest was performed using the standard protocol during bolus administration of intravenous contrast. Multiplanar CT image reconstructions and MIPs were obtained to evaluate the vascular anatomy. CONTRAST:  80 cc Isovue 370. COMPARISON:  Single-view of the chest earlier today. FINDINGS: The study is technically excellent. No pulmonary embolus is identified. Very small bilateral pleural effusions are seen. There is no pericardial effusion. Marked cardiomegaly is identified. A few atherosclerotic calcifications of the aorta are identified. No coronary artery calcifications are seen. There are no pathologically enlarged axillary, hilar or mediastinal lymphadenopathy. Small calcified precarinal nodes on the right are noted. The lungs demonstrate centrilobular emphysema. Dependent atelectasis is seen. There is diffuse, mild ground-glass attenuation bilaterally. No nodule, mass or consolidative process is seen. Visualized upper abdomen shows reflux of contrast material into the inferior vena cava and hepatic veins consistent with right heart insufficiency. Imaged upper abdomen is otherwise unremarkable. No focal bony abnormality is identified. Review of the MIP images confirms the above findings. IMPRESSION: Negative for pulmonary embolus. Diffuse mild ground-glass attenuation bilaterally is likely due to mild interstitial edema given marked cardiomegaly, tiny bilateral pleural effusions and evidence of right heart insufficiency. Centrilobular emphysema. Electronically Signed   By: Drusilla Kanner M.D.   On: 08/12/2015 14:16   Dg Chest Portable 1 View  08/12/2015  CLINICAL DATA:  Shortness of breath and cough for 2 weeks, history hypertension, smoking EXAM: PORTABLE CHEST 1 VIEW COMPARISON:  Portable exam 0716 hours ; prior studies from 2000 predate PACs and not available for comparison FINDINGS: Enlargement of cardiac  silhouette. Mediastinal contours normal. Atelectasis versus scarring at minor fissure. Interstitial infiltrates in the mid to lower lungs bilaterally could reflect acute edema, atypical infection or fibrosis, stability uncertain in the absence of prior exams. No pleural effusion or pneumothorax. Bones unremarkable. IMPRESSION: Enlargement of cardiac silhouette. Interstitial infiltrates in the mid to lower lungs question edema, atypical infection or fibrosis. Electronically Signed   By: Ulyses Southward M.D.   On: 08/12/2015 07:38   Medications: I have reviewed the patient's current medications. Scheduled Meds: . atorvastatin  40 mg Oral q1800  . carvedilol  3.125 mg Oral BID WC  . enoxaparin (LOVENOX) injection  40 mg Subcutaneous Q24H  . hydrochlorothiazide  25 mg Oral Daily  . ipratropium-albuterol  3 mL Nebulization TID  . lisinopril  10 mg Oral Daily  . sodium chloride flush  3 mL Intravenous Q12H  . sodium chloride flush  3 mL Intravenous Q12H   Continuous Infusions: . sodium chloride 10 mL/hr at 08/14/15 0612   PRN Meds:.sodium chloride, acetaminophen **OR** acetaminophen, ipratropium-albuterol, labetalol, senna-docusate, sodium chloride flush Assessment/Plan: Principal Problem:   Acute respiratory distress (HCC) Active Problems:   Hypertension   Acute combined systolic and diastolic heart failure (HCC)  Acute HFrEF: Patient presents  with acute hypoxia requiring Solumedrol, and BiPAP.  Patient's breathing improved after Duoneb and Lasix IV. D-dimer was elevated, but LE dopplers and CTA chest negative for DVT and PE.  Troponins negative.  CXR clear.  Echo revealed EF 25%. Cardiac cath without evidence of CAD, but demonstrates diffuse hypokinesis. Patient stable for discharge and will follow up with Cardiology and [facility]. - Coreg 3.125 mg BID - Lisinopril 10 mg - HCTZ 25 mg. Switch to Lasix 40 mg PO daily on discharge - Smoking cessation - [place]  COPD: Patient has 20 pack year smoking  history and CT chest demonstrated centrilobular emphysema.  Patient would likely benefit from PFTs as outpatient for staging and medication optimization. - Duonebs PRN. Discharge with Albuterol inhaler - PFTs as outpatient - Smoking cessation  Night Sweats: Patient reports recurrent soaking night sweats without fevers, chills, weight loss, or palpable lymph node.  Symptoms consistent with her previous menopausal sweats.  She has not had a colonoscopy. Patient would benefit from screening colonoscopy as an outpatient. - Colonoscopy as outpatient  FEN/GI: - HH  DVT Ppx: Lovenox  Dispo: Disposition is deferred at this time, awaiting improvement of current medical problems.  Anticipated discharge in approximately 1 day(s).   The patient does not have a current PCP (No Pcp Per Patient) and does need an [facility] hospital follow-up appointment after discharge.  The patient does not have transportation limitations that hinder transportation to clinic appointments.  .Services Needed at time of discharge: Y = Yes, Blank = No PT:   OT:   RN:   Equipment:   Other:     LOS: 2 days   Jana Half, MD 08/14/2015, 6:55 AM

## 2015-08-14 NOTE — Progress Notes (Signed)
Nsg Discharge Note  Admit Date:  08/12/2015 Discharge date: 08/14/2015   Rachel Kane to be D/C'd Home per MD order.  AVS completed.  Copy for chart, and copy for patient signed, and dated. Patient/caregiver able to verbalize understanding.  Discharge Medication:   Medication List    STOP taking these medications        hydrochlorothiazide 25 MG tablet  Commonly known as:  HYDRODIURIL      TAKE these medications        albuterol 108 (90 Base) MCG/ACT inhaler  Commonly known as:  PROVENTIL HFA;VENTOLIN HFA  Inhale 2 puffs into the lungs every 6 (six) hours as needed for wheezing or shortness of breath.     atorvastatin 40 MG tablet  Commonly known as:  LIPITOR  Take 1 tablet (40 mg total) by mouth daily at 6 PM.     carvedilol 3.125 MG tablet  Commonly known as:  COREG  Take 1 tablet (3.125 mg total) by mouth 2 (two) times daily with a meal.     furosemide 40 MG tablet  Commonly known as:  LASIX  Take 1 tablet (40 mg total) by mouth daily.     lisinopril 10 MG tablet  Commonly known as:  PRINIVIL,ZESTRIL  Take 1 tablet (10 mg total) by mouth daily.     loratadine 10 MG tablet  Commonly known as:  CLARITIN  Take 10 mg by mouth daily.        Discharge Assessment: Filed Vitals:   08/14/15 1039 08/14/15 1301  BP: 164/93 154/80  Pulse: 80 70  Temp: 97.9 F (36.6 C) 97.8 F (36.6 C)  Resp: 18 16   Skin clean, dry and intact without evidence of skin break down, no evidence of skin tears noted. Does have R femoral cath site.  IV catheter discontinued intact. Site without signs and symptoms of complications - no redness or edema noted at insertion site, patient denies c/o pain - only slight tenderness at site.  Dressing with slight pressure applied.  D/c Instructions-Education: Discharge instructions given to patient/family with verbalized understanding. D/c education completed with patient/family including follow up instructions, medication list, d/c activities  limitations if indicated, with other d/c instructions as indicated by MD - patient able to verbalize understanding, all questions fully answered. Patient instructed to return to ED, call 911, or call MD for any changes in condition.  Patient escorted via WC, and D/C home via private auto.  Rachel Kane Rachel Lose, RN 08/14/2015 3:41 PM

## 2015-08-14 NOTE — Discharge Instructions (Signed)

## 2015-08-14 NOTE — Progress Notes (Signed)
Hospital Problem List     Principal Problem:   Acute respiratory distress (HCC) Active Problems:   Hypertension   Acute combined systolic and diastolic heart failure (HCC)   Nonischemic cardiomyopathy [facility])    Patient Profile:   Primary Cardiologist: Dr. Eldridge Dace  55 y/o ? with a h/o HTN and tobacco abuse, who presented to the ED this AM with acute dyspnea, hypoxia, and marked hypertension.  Subjective   Had cardiac cath this AM. No immediate complications noted. Breathing is at baseline. Denies any chest pain or palpitations.  Inpatient Medications    . atorvastatin  40 mg Oral q1800  . carvedilol  3.125 mg Oral BID WC  . enoxaparin (LOVENOX) injection  40 mg Subcutaneous Q24H  . furosemide  40 mg Oral Daily  . ipratropium-albuterol  3 mL Nebulization TID  . lisinopril  10 mg Oral Daily  . sodium chloride flush  3 mL Intravenous Q12H  . sodium chloride flush  3 mL Intravenous Q12H    Vital Signs    Filed Vitals:   08/14/15 0840 08/14/15 0913 08/14/15 0950 08/14/15 1039  BP: 152/82 145/99 160/70 164/93  Pulse: 63 63 65 80  Temp:  98 F (36.7 C) 97.5 F (36.4 C) 97.9 F (36.6 C)  TempSrc:      Resp: Height:      Weight:      SpO2: 95% 97% 99% 93%    Intake/Output Summary (Last 24 hours) at 08/14/15 1055 Last data filed at 08/13/15 1345  Gross per 24 hour  Intake    360 ml  Output    600 ml  Net   -240 ml   Filed Weights   08/12/15 1825 08/14/15 0532  Weight: 153 lb 12.8 oz (69.763 kg) 152 lb 8.9 oz (69.2 kg)    Physical Exam    General: Well developed, well nourished, female appearing in no acute distress. Head: Normocephalic, atraumatic.  Neck: Supple without bruits, JVD not elevated. Lungs:  Resp regular and unlabored, CTA without wheezing or rales. Heart: RRR, S1, S2, no S3, S4, or murmur; no rub. Abdomen: Soft, non-tender, non-distended with normoactive bowel sounds. No hepatomegaly. No rebound/guarding. No obvious abdominal  masses. Extremities: No clubbing, cyanosis, or edema. Distal pedal pulses are 2+ bilaterally. Neuro: Alert and oriented X 3. Moves all extremities spontaneously. Psych: Normal affect.  Labs    CBC  Recent Labs  08/12/15 0700  WBC 15.2*  NEUTROABS 10.8*  HGB 13.6  HCT 42.3  MCV 95.9  PLT 342   Basic Metabolic Panel  Recent Labs  08/13/15 0125 08/14/15 0535  NA 136 141  K 3.6 4.5  CL 101 103  CO2 25 29  GLUCOSE 122* 99  BUN 16 15  CREATININE 0.98 0.96  CALCIUM 9.5 9.6   Liver Function Tests No results for input(s): AST, ALT, ALKPHOS, BILITOT, PROT, ALBUMIN in the last 72 hours. No results for input(s): LIPASE, AMYLASE in the last 72 hours. Cardiac Enzymes  Recent Labs  08/12/15 1327 08/12/15 1900 08/13/15 0125  TROPONINI 0.06* 0.06* 0.05*   BNP Invalid input(s): POCBNP D-Dimer  Recent Labs  08/12/15 0730  DDIMER 2.82*   Hemoglobin A1C  Recent Labs  08/12/15 1900  HGBA1C 5.0   Fasting Lipid Panel  Recent Labs  08/12/15 1900  CHOL 247*  HDL 72  LDLCALC 160*  TRIG 76  CHOLHDL 3.4     ECG    No new tracings.  Cardiac Studies and Radiology    Ct Angio Chest Pe W/cm &/or Wo Cm: 08/12/2015  CLINICAL DATA:  Exertional shortness of breath beginning today. EXAM: CT ANGIOGRAPHY CHEST WITH CONTRAST TECHNIQUE: Multidetector CT imaging of the chest was performed using the standard protocol during bolus administration of intravenous contrast. Multiplanar CT image reconstructions and MIPs were obtained to evaluate the vascular anatomy. CONTRAST:  80 cc Isovue 370. COMPARISON:  Single-view of the chest earlier today. FINDINGS: The study is technically excellent. No pulmonary embolus is identified. Very small bilateral pleural effusions are seen. There is no pericardial effusion. Marked cardiomegaly is identified. A few atherosclerotic calcifications of the aorta are identified. No coronary artery calcifications are seen. There are no pathologically  enlarged axillary, hilar or mediastinal lymphadenopathy. Small calcified precarinal nodes on the right are noted. The lungs demonstrate centrilobular emphysema. Dependent atelectasis is seen. There is diffuse, mild ground-glass attenuation bilaterally. No nodule, mass or consolidative process is seen. Visualized upper abdomen shows reflux of contrast material into the inferior vena cava and hepatic veins consistent with right heart insufficiency. Imaged upper abdomen is otherwise unremarkable. No focal bony abnormality is identified. Review of the MIP images confirms the above findings. IMPRESSION: Negative for pulmonary embolus. Diffuse mild ground-glass attenuation bilaterally is likely due to mild interstitial edema given marked cardiomegaly, tiny bilateral pleural effusions and evidence of right heart insufficiency. Centrilobular emphysema. Electronically Signed   By: Drusilla Kanner M.D.   On: 08/12/2015 14:16   Dg Chest Portable 1 View: 08/12/2015  CLINICAL DATA:  Shortness of breath and cough for 2 weeks, history hypertension, smoking EXAM: PORTABLE CHEST 1 VIEW COMPARISON:  Portable exam 0716 hours ; prior studies from 2000 predate PACs and not available for comparison FINDINGS: Enlargement of cardiac silhouette. Mediastinal contours normal. Atelectasis versus scarring at minor fissure. Interstitial infiltrates in the mid to lower lungs bilaterally could reflect acute edema, atypical infection or fibrosis, stability uncertain in the absence of prior exams. No pleural effusion or pneumothorax. Bones unremarkable. IMPRESSION: Enlargement of cardiac silhouette. Interstitial infiltrates in the mid to lower lungs question edema, atypical infection or fibrosis. Electronically Signed   By: Ulyses Southward M.D.   On: 08/12/2015 07:38    Echocardiogram: 08/14/2015 Study Conclusions  - Left ventricle: The cavity size was moderately dilated. There was  mild concentric hypertrophy. Systolic function was  moderately  reduced. The estimated ejection fraction was in the range of 35%  to 40%. Moderate diffuse hypokinesis. Features are consistent  with a pseudonormal left ventricular filling pattern, with  concomitant abnormal relaxation and increased filling pressure  (grade 2 diastolic dysfunction). Doppler parameters are  consistent with high ventricular filling pressure. - Aortic valve: There was trivial regurgitation. - Mitral valve: There was mild regurgitation. - Left atrium: The atrium was mildly dilated.  Cardiac Catheterization: 08/14/2015  Widely patent coronary arteries. Minimal luminal irregularities are noted in the LAD.  Significant left ventricular systolic dysfunction with estimated ejection fraction 25-30%.  Normal right heart pressures and left ventricular filling pressures with pulmonary wedge mean, 12 mmHg. Left ventricular end-diastolic pressure 8 mmHg.   Assessment & Plan    1. Acute Combined Systolic and Diastolic CHF/ Nonischemic Cardiomyopathy - presented with acute dyspnea. D-dimer elevated but CTA negative for PE. Received IV Lasix with complete resolution of her symptoms. Net output of -2.0L. - echo showed a reduced EF of 35-40% with moderate diffuse hypokinesis. With a newly diagnosed cardiomyopathy, she will require an ischemic evaluation.  - cardiac cath performed  today and showed widely patent coronary arteries. EF noted to be 25-30%.  - continue BB and ACE-I. Admitting team discussed with Dr. Eldridge Dace earlier today about diuretic dosing. Will stop HCTZ and start PO Lasix 40mg  daily. Would consider addition of Spironolactone at outpatient follow-up pending recheck of creatinine and potassium at that time. Would not further titrate her BB at this time with HR in the low to mid-50's overnight.   2. Hypertensive Urgency - BP has improved, most recent readings with SBP's in the 140's - 160's. - started on Lisinopril 10mg  daily. Switch HCTZ to Lasix as  above. - Will add low-dose Coreg with newly found cardiomyopathy.  3.Tobacco Abuse - cessation advised.  Two-week Cardiology follow-up has been arranged.  Signed, Rachel Kane , PA-C 10:55 AM 08/14/2015 Pager: [phone]   I have examined the patient and reviewed assessment and plan and discussed with patient.  Agree with above as stated.  Check TSH nad iron studies to eval Nonischemic CM.  Lance Muss

## 2015-08-14 NOTE — H&P (View-Only) (Signed)
  Echocardiogram 2D Echocardiogram has been performed.  Rachel Kane 08/13/2015, 11:51 AM 

## 2015-08-14 NOTE — Interval H&P Note (Signed)
Cath Lab Visit (complete for each Cath Lab visit)  Clinical Evaluation Leading to the Procedure:   ACS: Yes.    Non-ACS:    Anginal Classification: CCS III  Anti-ischemic medical therapy: Minimal Therapy (1 class of medications)  Non-Invasive Test Results: No non-invasive testing performed  Prior CABG: No previous CABG      History and Physical Interval Note:  08/14/2015 7:34 AM  Rachel Kane  has presented today for surgery, with the diagnosis of sob  The various methods of treatment have been discussed with the patient and family. After consideration of risks, benefits and other options for treatment, the patient has consented to  Procedure(s): Right/Left Heart Cath and Coronary Angiography (N/A) as a surgical intervention .  The patient's history has been reviewed, patient examined, no change in status, stable for surgery.  I have reviewed the patient's chart and labs.  Questions were answered to the patient's satisfaction.     Lyn Records III

## 2015-08-14 NOTE — Progress Notes (Addendum)
Site area: Right groin a 5 french arterial and 7 french venous sheath was removed  Site Prior to Removal:  Level 0  Pressure Applied For 20 MINUTES   :: Bedrest Beginning at 08:40:00  Manual:   Yes.    Patient Status During Pull:  stable  Post Pull Groin Site:  Level 0  Post Pull Instructions Given:  Yes.    Post Pull Pulses Present:  Yes.    Dressing Applied:  Yes.    Comments:  VS remain stable during sheath pull

## 2015-08-26 ENCOUNTER — Ambulatory Visit (INDEPENDENT_AMBULATORY_CARE_PROVIDER_SITE_OTHER): Payer: 59 | Admitting: Internal Medicine

## 2015-08-26 ENCOUNTER — Encounter: Payer: Self-pay | Admitting: Internal Medicine

## 2015-08-26 VITALS — BP 151/70 | HR 72 | Temp 98.5°F | Wt 160.6 lb

## 2015-08-26 DIAGNOSIS — F17210 Nicotine dependence, cigarettes, uncomplicated: Secondary | ICD-10-CM | POA: Diagnosis not present

## 2015-08-26 DIAGNOSIS — Z720 Tobacco use: Secondary | ICD-10-CM

## 2015-08-26 DIAGNOSIS — I5041 Acute combined systolic (congestive) and diastolic (congestive) heart failure: Secondary | ICD-10-CM | POA: Diagnosis not present

## 2015-08-26 DIAGNOSIS — Z0000 Encounter for general adult medical examination without abnormal findings: Secondary | ICD-10-CM | POA: Insufficient documentation

## 2015-08-26 MED ORDER — LISINOPRIL 20 MG PO TABS
20.0000 mg | ORAL_TABLET | Freq: Every day | ORAL | Status: DC
Start: 2015-08-26 — End: 2015-09-22

## 2015-08-26 NOTE — Patient Instructions (Signed)
1. Increase Lisinopril to 20 mg daily.  It is OK to take two 10 mg pills until you get the new prescription. 2. Continue your current Carvedilol, Lasix, and Lipitor. 3. Continue your efforts to stop smoking.  Call us if you need any help with stopping. 4. Return to clinic in 3 months.

## 2015-08-26 NOTE — Progress Notes (Signed)
Medicine attending: Medical history, presenting problems, physical findings, and medications, reviewed with resident physician Dr Venia Minks on the day of the patient visit and I concur with his evaluation and management plan. Patient familiar to me from recent hospital stay. Found to have significant hypertensive cardiomyopathy with minimal coronary disease on cath.She is doing very well on recently started medical regimen.

## 2015-08-26 NOTE — Assessment & Plan Note (Signed)
Patient has been doing well since discharge.  ROS negative.  She has been compliant with all medications and mostly compliant with diet, excepting some chinese soup.  She is able to walk long distances without becoming SOB.  She takes her BPs at home, which are usually 130-150s/70-90s. Her pulse remains low 60s.    A/P: HFrEF, EF 25-30%.  Patient doing well on current medications.  Will increase Lisinopril to 20 mg for better BP control and continue current Lasix dose. Will continue current Carvedilol dosing due to borderline bradycardia. - Lisinopril 20 mg daily - Lasix 40 mg daily - Carvedilol 3.125 mg daily - Atorvastatin 40 mg daily - Smoking cessation [ ]  BMP

## 2015-08-26 NOTE — Assessment & Plan Note (Signed)
Patient is due for colonoscopy. - Colonoscopy

## 2015-08-26 NOTE — Progress Notes (Signed)
Patient ID: Rachel Kane, female   DOB: [birth year], 55 y.o.   MRN: [id number]   Subjective:   Patient ID: Rachel Kane female   DOB: 1960-11-07 55 y.o.   MRN: [id number]  HPI: [name] is a 55 y.o. female with PMH as below, here for hospital f/u new onset HFrEF.  Please see Problem-Based charting for the status of the patient's chronic medical issues.     Past Medical History  Diagnosis Date  . Essential hypertension     a. Dx @ least 20 years ago.  . Tobacco abuse   . 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  Marland Kitchen History of pneumonia     a. 2000: required chest tube.  . Family history of adverse reaction to anesthesia     mother has nausea   . Nonischemic cardiomyopathy (HCC)     a. 08/2015: EF 25-30% by cath, cors widely patent.   Current Outpatient Prescriptions  Medication Sig Dispense Refill  . albuterol (PROVENTIL HFA;VENTOLIN HFA) 108 (90 Base) MCG/ACT inhaler Inhale 2 puffs into the lungs every 6 (six) hours as needed for wheezing or shortness of breath. 1 Inhaler 2  . atorvastatin (LIPITOR) 40 MG tablet Take 1 tablet (40 mg total) by mouth daily at 6 PM. 30 tablet 1  . carvedilol (COREG) 3.125 MG tablet Take 1 tablet (3.125 mg total) by mouth 2 (two) times daily with a meal. 60 tablet 1  . furosemide (LASIX) 40 MG tablet Take 1 tablet (40 mg total) by mouth daily. 30 tablet 1  . lisinopril (PRINIVIL,ZESTRIL) 20 MG tablet Take 1 tablet (20 mg total) by mouth daily. 30 tablet 3  . loratadine (CLARITIN) 10 MG tablet Take 10 mg by mouth daily.     No current facility-administered medications for this visit.   Family History  Problem Relation Age of Onset  . Other Father     died of suicide in setting of chronic pain.  . Other Mother     alive and well   Social History   Social History  . Marital Status: Widowed    Spouse Name: N/A  . Number of Children: N/A  . Years of Education: N/A   Social History Main  Topics  . Smoking status: Current Every Day Smoker -- 0.30 packs/day for 30 years    Types: Cigarettes  . Smokeless tobacco: Never Used     Comment: down to 2 cigs/day.  . Alcohol Use: 0.0 oz/week    0 Standard drinks or equivalent per week     Comment: 1 beer/day during the week.  Sometimes more on the weekends.  . Drug Use: No  . Sexual Activity: Not Asked   Other Topics Concern  . None   Social History Narrative   Lives in [place], [place].  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   Review of Systems: No fevers, chills, CP, SOB, dizziness, lightheadedness, orthostasis, weight gain, orthopnea, or leg swelling. Objective:  Physical Exam: Filed Vitals:   08/26/15 1459  BP: 151/70  Pulse: 72  Temp: 98.5 F (36.9 C)  TempSrc: Oral  Weight: 160 lb 9.6 oz (72.848 kg)  SpO2: 98%   Physical Exam  Constitutional: She is oriented to person, place, and time and well-developed, well-nourished, and in no distress. No distress.  HENT:  Head: Normocephalic and atraumatic.  Eyes: EOM are normal.  No scleral icterus.  Neck: No tracheal deviation present.  Cardiovascular: Normal rate, regular rhythm, normal heart sounds and intact distal pulses.   Pulmonary/Chest: Effort normal and breath sounds normal. No stridor. No respiratory distress. She has no wheezes. She has no rales.  Musculoskeletal: She exhibits no edema.  Neurological: She is alert and oriented to person, place, and time.  Skin: Skin is warm and dry. She is not diaphoretic.     Assessment & Plan:   Patient and case were discussed with Dr. Cyndie Chime.  Please refer to Problem Based charting for further documentation.

## 2015-08-26 NOTE — Assessment & Plan Note (Signed)
Patient has only smoked 2 cigarettes since discharge.  She is quitting cold [place] and reports she is doing well with it.  CT chest during admission noted emphysematous changes.  A/P: Tobacco use, improving.  Patient counseled that we have resources to help her quit, but she is trying cold [place] right now. Will order PFTs for staging of COPD. - PFTs

## 2015-08-27 ENCOUNTER — Encounter: Payer: Self-pay | Admitting: Gastroenterology

## 2015-08-27 LAB — BMP8+ANION GAP
Anion Gap: 15 mmol/L (ref 10.0–18.0)
BUN / CREAT RATIO: 20 (ref 9–23)
BUN: 16 mg/dL (ref 6–24)
CHLORIDE: 100 mmol/L (ref 96–106)
CO2: 26 mmol/L (ref 18–29)
Calcium: 9.2 mg/dL (ref 8.7–10.2)
Creatinine, Ser: 0.82 mg/dL (ref 0.57–1.00)
GFR calc non Af Amer: 83 mL/min/{1.73_m2} (ref >59)
GFR, EST AFRICAN AMERICAN: 95 mL/min/{1.73_m2} (ref >59)
GLUCOSE: 100 mg/dL — AB (ref 65–99)
POTASSIUM: 4.2 mmol/L (ref 3.5–5.2)
Sodium: 141 mmol/L (ref 134–144)

## 2015-08-31 ENCOUNTER — Encounter: Payer: Self-pay | Admitting: Cardiology

## 2015-08-31 ENCOUNTER — Ambulatory Visit (INDEPENDENT_AMBULATORY_CARE_PROVIDER_SITE_OTHER): Payer: 59 | Admitting: Cardiology

## 2015-08-31 VITALS — BP 136/78 | HR 55 | Ht 64.0 in | Wt 158.0 lb

## 2015-08-31 DIAGNOSIS — I509 Heart failure, unspecified: Secondary | ICD-10-CM

## 2015-08-31 NOTE — Patient Instructions (Signed)
Medication Instructions:   Your physician recommends that you continue on your current medications as directed. Please refer to the Current Medication list given to you today.    If you need a refill on your cardiac medications before your next appointment, please call your pharmacy.  Labwork:  NONE ORDER TODAY    Testing/Procedures:  NONE ORDER TODAY    Follow-Up:   WITH PHARM D IN 3 WEEKS FOR BLOOD PRESSURE CHECK AND MEDICATIONS EVALUATION  IN 2 TO 3 MONTHS WITH DR Eldridge Dace   Any Other Special Instructions Will Be Listed Below (If Applicable).  Low-Sodium Eating Plan Sodium raises blood pressure and causes water to be held in the body. Getting less sodium from food will help lower your blood pressure, reduce any swelling, and protect your heart, liver, and kidneys. We get sodium by adding salt (sodium chloride) to food. Most of our sodium comes from canned, boxed, and frozen foods. Restaurant foods, fast foods, and pizza are also very high in sodium. Even if you take medicine to lower your blood pressure or to reduce fluid in your body, getting less sodium from your food is important. WHAT IS MY PLAN? Most people should limit their sodium intake to 2,300 mg a day. Your health care provider recommends that you limit your sodium intake to __________ a day.  WHAT DO I NEED TO KNOW ABOUT THIS EATING PLAN? For the low-sodium eating plan, you will follow these general guidelines:  Choose foods with a % Daily Value for sodium of less than 5% (as listed on the food label).   Use salt-free seasonings or herbs instead of table salt or sea salt.   Check with your health care provider or pharmacist before using salt substitutes.   Eat fresh foods.  Eat more vegetables and fruits.  Limit canned vegetables. If you do use them, rinse them well to decrease the sodium.   Limit cheese to 1 oz (28 g) per day.   Eat lower-sodium products, often labeled as "lower sodium" or "no salt  added."  Avoid foods that contain monosodium glutamate (MSG). MSG is sometimes added to [place] food and some canned foods.  Check food labels (Nutrition Facts labels) on foods to learn how much sodium is in one serving.  Eat more home-cooked food and less restaurant, buffet, and fast food.  When eating at a restaurant, ask that your food be prepared with less salt, or no salt if possible.  HOW DO I READ FOOD LABELS FOR SODIUM INFORMATION? The Nutrition Facts label lists the amount of sodium in one serving of the food. If you eat more than one serving, you must multiply the listed amount of sodium by the number of servings. Food labels may also identify foods as:  Sodium free--Less than 5 mg in a serving.  Very low sodium--35 mg or less in a serving.  Low sodium--140 mg or less in a serving.  Light in sodium--50% less sodium in a serving. For example, if a food that usually has 300 mg of sodium is changed to become light in sodium, it will have 150 mg of sodium.  Reduced sodium--25% less sodium in a serving. For example, if a food that usually has 400 mg of sodium is changed to reduced sodium, it will have 300 mg of sodium. WHAT FOODS CAN I EAT? Grains Low-sodium cereals, including oats, puffed wheat and rice, and shredded wheat cereals. Low-sodium crackers. Unsalted rice and pasta. Lower-sodium bread.  Vegetables Frozen or fresh vegetables. Low-sodium  or reduced-sodium canned vegetables. Low-sodium or reduced-sodium tomato sauce and paste. Low-sodium or reduced-sodium tomato and vegetable juices.  Fruits Fresh, frozen, and canned fruit. Fruit juice.  Meat and Other Protein Products Low-sodium canned tuna and salmon. Fresh or frozen meat, poultry, seafood, and fish. Lamb. Unsalted nuts. Dried beans, peas, and lentils without added salt. Unsalted canned beans. Homemade soups without salt. Eggs.  Dairy Milk. Soy milk. Ricotta cheese. Low-sodium or reduced-sodium cheeses.  Yogurt.  Condiments Fresh and dried herbs and spices. Salt-free seasonings. Onion and garlic powders. Low-sodium varieties of mustard and ketchup. Fresh or refrigerated horseradish. Lemon juice.  Fats and Oils Reduced-sodium salad dressings. Unsalted butter.  Other Unsalted popcorn and pretzels.  The items listed above may not be a complete list of recommended foods or beverages. Contact your dietitian for more options. WHAT FOODS ARE NOT RECOMMENDED? Grains Instant hot cereals. Bread stuffing, pancake, and biscuit mixes. Croutons. Seasoned rice or pasta mixes. Noodle soup cups. Boxed or frozen macaroni and cheese. Self-rising flour. Regular salted crackers. Vegetables Regular canned vegetables. Regular canned tomato sauce and paste. Regular tomato and vegetable juices. Frozen vegetables in sauces. Salted [place] fries. Olives. Rosita Fire. Relishes. Sauerkraut. Salsa. Meat and Other Protein Products Salted, canned, smoked, spiced, or pickled meats, seafood, or fish. Bacon, ham, sausage, hot dogs, corned beef, chipped beef, and packaged luncheon meats. Salt pork. Jerky. Pickled herring. Anchovies, regular canned tuna, and sardines. Salted nuts. Dairy Processed cheese and cheese spreads. Cheese curds. Blue cheese and cottage cheese. Buttermilk.  Condiments Onion and garlic salt, seasoned salt, table salt, and sea salt. Canned and packaged gravies. Worcestershire sauce. Tartar sauce. Barbecue sauce. Teriyaki sauce. Soy sauce, including reduced sodium. Steak sauce. Fish sauce. Oyster sauce. Cocktail sauce. Horseradish that you find on the shelf. Regular ketchup and mustard. Meat flavorings and tenderizers. Bouillon cubes. Hot sauce. Tabasco sauce. Marinades. Taco seasonings. Relishes. Fats and Oils Regular salad dressings. Salted butter. Margarine. Ghee. Bacon fat.  Other Potato and tortilla chips. Corn chips and puffs. Salted popcorn and pretzels. Canned or dried soups. Pizza. Frozen  entrees and pot pies.  The items listed above may not be a complete list of foods and beverages to avoid. Contact your dietitian for more information.   This information is not intended to replace advice given to you by your health care provider. Make sure you discuss any questions you have with your health care provider.   Document Released: 08/13/2001 Document Revised: 03/14/2014 Document Reviewed: 12/26/2012 Elsevier Interactive Patient Education [employer]

## 2015-08-31 NOTE — Progress Notes (Signed)
08/31/2015 Rachel Kane   [birth year]  [id number]  Primary Physician Serena Colonel, DO Primary Cardiologist: Dr. Eldridge Dace   Reason for Visit/CC: [facility] f/u for Acute Systolic HF + Hypertension   HPI:  55 y/o female who presents to clinic today for post hospital f/u. She recently presented to [facility] with acute  dyspnea, hypoxia and marked hypertension. Systolic BP was in the 200s and diastolic pressures in the 100s. D-dimer was elevated but CTA was negative for PE. She received IV Lasix with complete resolution of her symptoms. Net output of -2.0L. 2D echo showed a reduced EF of 35-40% with moderate diffuse hypokinesis. With a newly diagnosed cardiomyopathy, cardiac catheterization was recommended. LHC showed widely patent coronaries. Minimal luminal irregularities were noted in the LAD. EF by cath was 25-30%. She also had normal right heart and left ventricular filling pressures with pulmonary wedge mean of 12 mmHg. LVEDP was 8 mmHg. Thyroid and Iron studies were recommended to assess for other etiologies to explain her NICM. Both lab studies were unremarkable. She was prescribed lisinopril, Coreg and lasix. She also had a f/u BMP last week, 08/26/15, which revealed normal renal function with SCr of 0.82. K was 4.2. . Her PCP increased her lisinopril from 10 mg to 20 mg just 4 days ago.   Today in f/u, she reports that she has done well. She denies any recurrent dyspnea. No LEE, orthopnea or PND. No CP. No side effects from her new medications. No post cath complications. She has been monitoring her BP closely at home and pressures have been well controlled. Her BP is well controlled at 130/78. EKG shows sinus brady with a rate of 55 bpm.    Current Outpatient Prescriptions  Medication Sig Dispense Refill  . albuterol (PROVENTIL HFA;VENTOLIN HFA) 108 (90 Base) MCG/ACT inhaler Inhale 2 puffs into the lungs every 6 (six) hours as needed for wheezing or shortness of breath. 1 Inhaler 2  .  atorvastatin (LIPITOR) 40 MG tablet Take 1 tablet (40 mg total) by mouth daily at 6 PM. 30 tablet 1  . carvedilol (COREG) 3.125 MG tablet Take 1 tablet (3.125 mg total) by mouth 2 (two) times daily with a meal. 60 tablet 1  . furosemide (LASIX) 40 MG tablet Take 1 tablet (40 mg total) by mouth daily. 30 tablet 1  . lisinopril (PRINIVIL,ZESTRIL) 20 MG tablet Take 1 tablet (20 mg total) by mouth daily. 30 tablet 3  . loratadine (CLARITIN) 10 MG tablet Take 10 mg by mouth as needed for allergies.     No current facility-administered medications for this visit.    Allergies  Allergen Reactions  . Sulfa Antibiotics Other (See Comments)    Childhood allergy-unknown    Social History   Social History  . Marital Status: Widowed    Spouse Name: N/A  . Number of Children: N/A  . Years of Education: N/A   Occupational History  . Not on file.   Social History Main Topics  . Smoking status: Current Some Day Smoker -- 0.30 packs/day for 30 years    Types: Cigarettes  . Smokeless tobacco: Never Used     Comment: down to 2 cigs/week  . Alcohol Use: 0.0 oz/week    0 Standard drinks or equivalent per week     Comment: 1 beer/day during the week.  Sometimes more on the weekends.  . Drug Use: No  . Sexual Activity: Not on file   Other Topics Concern  . Not on file  Social History Narrative   Lives in [place], [place].  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     Review of Systems: General: negative for chills, fever, night sweats or weight changes.  Cardiovascular: negative for chest pain, dyspnea on exertion, edema, orthopnea, palpitations, paroxysmal nocturnal dyspnea or shortness of breath Dermatological: negative for rash Respiratory: negative for cough or wheezing Urologic: negative for hematuria Abdominal: negative for nausea, vomiting, diarrhea, bright red blood per rectum, melena, or hematemesis Neurologic: negative for visual  changes, syncope, or dizziness All other systems reviewed and are otherwise negative except as noted above.    Blood pressure 136/78, pulse 55, height  (1.626 m), weight 158 lb (71.668 kg).  General appearance: alert, cooperative and no distress Neck: no carotid bruit and no JVD Lungs: clear to auscultation bilaterally Heart: regular rate and rhythm, S1, S2 normal, no murmur, click, rub or gallop Extremities: no LEE Pulses: 2+ and symmetric Skin: warm and dry Neurologic: Grossly normal  EKG sinus brady. 55 bpm.   ASSESSMENT AND PLAN:   1.Chronic Systolic HF/NICM: EF 35-40% by recent echo. Normal coronaries on LHC. She was diuresed with IV lasix during hospitalization and transitioned to PO, 40 mg daily. She remains euvolemic. No s/s of volume overload. Recent f/u BMP showed normal renal function and K. We will continued BB, ACE-I and diuretic. Her PCP recently increased her lisinopril to 20 mg 4 days ago. Thus we will wait to further titrate dose to 40 mg. There is no room in HR to allow for further titration of her Coreg at this time. HR is 55 bpm. Continue 3.125 mg BID. Continue lasix. We discussed importance of a low sodium diet and daily weights.   2. HTN: BP is much improved. Continue Coreg, lisinopril and Lasix.    PLAN  F/u in 3 weeks with pharmacy for repeat BP check and for med titration of HF meds. F/u with Dr. Eldridge Dace in 2-3 months.  Robbie Lis PA-C 08/31/2015 8:29 AM

## 2015-09-15 ENCOUNTER — Ambulatory Visit (HOSPITAL_COMMUNITY)
Admission: RE | Admit: 2015-09-15 | Discharge: 2015-09-15 | Disposition: A | Discharge status: Home / Self Care | Payer: 59 | Source: Ambulatory Visit | Attending: Internal Medicine | Admitting: Internal Medicine

## 2015-09-15 DIAGNOSIS — Z720 Tobacco use: Secondary | ICD-10-CM | POA: Insufficient documentation

## 2015-09-15 DIAGNOSIS — J449 Chronic obstructive pulmonary disease, unspecified: Secondary | ICD-10-CM | POA: Diagnosis not present

## 2015-09-15 LAB — PULMONARY FUNCTION TEST
DL/VA % pred: 49 %
DL/VA: 2.3 ml/min/mmHg/L
DLCO UNC % PRED: 43 %
DLCO unc: 9.86 ml/min/mmHg
FEF 25-75 PRE: 1.01 L/s
FEF 25-75 Post: 1.11 L/sec
FEF2575-%CHANGE-POST: 9 %
FEF2575-%PRED-POST: 42 %
FEF2575-%Pred-Pre: 39 %
FEV1-%CHANGE-POST: 1 %
FEV1-%Pred-Post: 71 %
FEV1-%Pred-Pre: 70 %
FEV1-POST: 1.9 L
FEV1-PRE: 1.87 L
FEV1FVC-%Change-Post: 0 %
FEV1FVC-%Pred-Pre: 82 %
FEV6-%CHANGE-POST: 1 %
FEV6-%PRED-POST: 86 %
FEV6-%Pred-Pre: 84 %
FEV6-POST: 2.82 L
FEV6-Pre: 2.76 L
FEV6FVC-%Change-Post: 0 %
FEV6FVC-%Pred-Post: 101 %
FEV6FVC-%Pred-Pre: 100 %
FVC-%CHANGE-POST: 1 %
FVC-%PRED-POST: 84 %
FVC-%Pred-Pre: 83 %
FVC-POST: 2.85 L
FVC-Pre: 2.82 L
Post FEV1/FVC ratio: 67 %
Post FEV6/FVC ratio: 99 %
Pre FEV1/FVC ratio: 66 %
Pre FEV6/FVC Ratio: 98 %
RV % PRED: 133 %
RV: 2.39 L
TLC % pred: 107 %
TLC: 5.28 L

## 2015-09-15 MED ORDER — ALBUTEROL SULFATE (2.5 MG/3ML) 0.083% IN NEBU
2.5000 mg | INHALATION_SOLUTION | Freq: Once | RESPIRATORY_TRACT | Status: AC
  Administered 2015-09-15: 2.5 mg via RESPIRATORY_TRACT

## 2015-09-22 ENCOUNTER — Ambulatory Visit (INDEPENDENT_AMBULATORY_CARE_PROVIDER_SITE_OTHER): Payer: 59 | Admitting: Pharmacist

## 2015-09-22 VITALS — BP 148/80 | HR 58 | Wt 160.0 lb

## 2015-09-22 DIAGNOSIS — I5041 Acute combined systolic (congestive) and diastolic (congestive) heart failure: Secondary | ICD-10-CM | POA: Diagnosis not present

## 2015-09-22 LAB — BASIC METABOLIC PANEL
BUN: 10 mg/dL (ref 7–25)
CHLORIDE: 105 mmol/L (ref 98–110)
CO2: 25 mmol/L (ref 20–31)
CREATININE: 0.84 mg/dL (ref 0.50–1.05)
Calcium: 9 mg/dL (ref 8.6–10.4)
Glucose, Bld: 82 mg/dL (ref 65–99)
POTASSIUM: 4.3 mmol/L (ref 3.5–5.3)
Sodium: 140 mmol/L (ref 135–146)

## 2015-09-22 MED ORDER — LISINOPRIL 20 MG PO TABS: 20.0000 mg | ORAL_TABLET | Freq: Every day | ORAL | Status: DC

## 2015-09-22 MED ORDER — ATORVASTATIN CALCIUM 40 MG PO TABS: 40.0000 mg | ORAL_TABLET | Freq: Every day | ORAL | Status: DC

## 2015-09-22 MED ORDER — CARVEDILOL 3.125 MG PO TABS: 3.1250 mg | ORAL_TABLET | Freq: Two times a day (BID) | ORAL | Status: DC

## 2015-09-22 NOTE — Patient Instructions (Signed)
Continue to check your blood pressure at home.  If your top number is more than 140 most days of the week, let us know.    Follow up with Dr. Eldridge Dace in September.

## 2015-09-22 NOTE — Progress Notes (Signed)
Patient ID: [name]                 DOB: 1960/10/29                      MRN: [id number]     HPI: Rachel Kane is a 55 y.o. female referred by Rachel Kane to HTN clinic.  She was seen by Rachel Kane on 6/26 as a post hospital follow up on 6/26 after a CHF exacerbation.  She was admitted in June for acute dyspnea and marked hypertension.  SBP was in the 200s.  She was treated with furosemide and symptoms resolved.  She was diagnosed with cardiomyopathy (EF 35-40%).  She was placed on lisinopril, carvedilol and furosemide.  She is here today for BP check and titration of HF meds.   Pt states she is doing well since her last visit.  She has more energy than she did before.  Able to do all of her activities with no SOB.  No swelling, dizziness, chest pain.  She is compliant with her medications.   Current HTN meds: lisinopril 20mg  daily, carvedilol 3.125mg  BID, furosemide 40mg  daily  BP goal: <140/90  Diet: Pt is limiting her salt intake.  She has been eating a lot of fresh fruits this summer.    Home BP readings: Pt checks her BP and weight daily at home.  Her systolic BP is consistently 110s-130s and HR in the 60s.  Her weight at home has been stable.   Wt Readings from Last 3 Encounters:  08/31/15 158 lb (71.668 kg)  08/26/15 160 lb 9.6 oz (72.848 kg)  08/14/15 152 lb 8.9 oz (69.2 kg)   BP Readings from Last 3 Encounters:  08/31/15 136/78  08/26/15 151/70  08/14/15 154/80   Pulse Readings from Last 3 Encounters:  08/31/15 55  08/26/15 72  08/14/15 70    Renal function: CrCl cannot be calculated (Unknown ideal weight.).  Past Medical History  Diagnosis Date  . Essential hypertension     a. Dx @ least 20 years ago.  . Tobacco abuse   . 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  Marland Kitchen History of pneumonia     a. 2000: required chest tube.  . Family history of adverse reaction to anesthesia     mother has nausea   .  Nonischemic cardiomyopathy (HCC)     a. 08/2015: EF 25-30% by cath, cors widely patent.    Current Outpatient Prescriptions on File Prior to Visit  Medication Sig Dispense Refill  . albuterol (PROVENTIL HFA;VENTOLIN HFA) 108 (90 Base) MCG/ACT inhaler Inhale 2 puffs into the lungs every 6 (six) hours as needed for wheezing or shortness of breath. 1 Inhaler 2  . atorvastatin (LIPITOR) 40 MG tablet Take 1 tablet (40 mg total) by mouth daily at 6 PM. 30 tablet 1  . carvedilol (COREG) 3.125 MG tablet Take 1 tablet (3.125 mg total) by mouth 2 (two) times daily with a meal. 60 tablet 1  . furosemide (LASIX) 40 MG tablet Take 1 tablet (40 mg total) by mouth daily. 30 tablet 1  . lisinopril (PRINIVIL,ZESTRIL) 20 MG tablet Take 1 tablet (20 mg total) by mouth daily. 30 tablet 3  . loratadine (CLARITIN) 10 MG tablet Take 10 mg by mouth as needed for allergies.     No current facility-administered medications on file prior to visit.    Allergies  Allergen Reactions  . Sulfa Antibiotics Other (See Comments)    Childhood allergy-unknown     Assessment/Plan: 1.  Hypertension- Pt's BP elevated in clinic today but she states she gets nervous when she is in the physician office.  Home BP has been at goal.  Pt feeling much better since hospitalization.  Will continue current meds.  Check BMET today given increase in lisinopril last month.  Follow up with Dr. Eldridge Kane in September.

## 2015-10-08 ENCOUNTER — Telehealth: Payer: Self-pay | Admitting: *Deleted

## 2015-10-08 NOTE — Telephone Encounter (Signed)
Dr Myrtie Neither: pt is scheduled for [facility] 8/14 and direct screening colonoscopy 8/28.  Pt has hx of CHF and cardiomyopathy.  Last admission to hospital with CHF exacerbation was 08/31/15.  Echo 6/2 showed EF of 25-30% and Echo 6/8 showed EF of 35-40%.  Do you want this pt to have OV with you before scheduling screening colonoscopy?  Thanks, Olegario Messier in [facility]

## 2015-10-09 NOTE — Telephone Encounter (Signed)
Per Dr. Myrtie Neither pt needs to be a direct colon at [facility] hospital. Sent staff message to [facility] to schedule procedure. Maisie Hauser-PV

## 2015-10-09 NOTE — Telephone Encounter (Signed)
I do not need to see her in the office, but her procedure needs to be done in the [facility] outpatient endo lab due to low EF. Thanks - HD

## 2015-10-12 NOTE — Telephone Encounter (Signed)
Left message on machine to call back  

## 2015-10-12 NOTE — Telephone Encounter (Signed)
Forward to Dr Irving Burton nurse

## 2015-10-13 ENCOUNTER — Other Ambulatory Visit: Payer: Self-pay | Admitting: Internal Medicine

## 2015-10-13 NOTE — Telephone Encounter (Signed)
Next available is 12/11/15 830 (tues block) or 10/23/15 (hospital doc)

## 2015-10-13 NOTE — Telephone Encounter (Signed)
The pt wants to call back to set up appt for [facility] she has a pre visit for 10/19/15.  Dr Myrtie Neither next available is

## 2015-10-16 ENCOUNTER — Other Ambulatory Visit: Payer: Self-pay

## 2015-10-16 DIAGNOSIS — Z800 Family history of malignant neoplasm of digestive organs: Secondary | ICD-10-CM

## 2015-10-16 NOTE — Telephone Encounter (Signed)
The pt has been rescheduled to 12/11/15 at [facility] and previsit was also rescheduled. The pt is aware.  Hospital orders and Amb referral have been entered.

## 2015-11-02 ENCOUNTER — Encounter: Payer: 59 | Admitting: Gastroenterology

## 2015-11-05 ENCOUNTER — Encounter: Payer: 59 | Admitting: Internal Medicine

## 2015-12-01 ENCOUNTER — Ambulatory Visit: Payer: 59 | Admitting: Interventional Cardiology

## 2015-12-04 ENCOUNTER — Ambulatory Visit (AMBULATORY_SURGERY_CENTER): Payer: Self-pay

## 2015-12-04 VITALS — Ht 64.0 in | Wt 166.0 lb

## 2015-12-04 DIAGNOSIS — Z1211 Encounter for screening for malignant neoplasm of colon: Secondary | ICD-10-CM

## 2015-12-04 MED ORDER — SUPREP BOWEL PREP KIT 17.5-3.13-1.6 GM/177ML PO SOLN: 1.0000 | Freq: Once | ORAL | 0 refills | Status: AC

## 2015-12-04 NOTE — Progress Notes (Signed)
No allergies to eggs or soy No diet meds No home oxygen No past problems with anesthesia  Registered emmi 

## 2015-12-08 ENCOUNTER — Telehealth: Payer: Self-pay | Admitting: Gastroenterology

## 2015-12-08 ENCOUNTER — Encounter (HOSPITAL_COMMUNITY): Payer: Self-pay | Admitting: *Deleted

## 2015-12-08 NOTE — Telephone Encounter (Signed)
Patient picked up sample 

## 2015-12-10 ENCOUNTER — Other Ambulatory Visit: Payer: Self-pay | Admitting: Internal Medicine

## 2015-12-10 NOTE — Anesthesia Preprocedure Evaluation (Addendum)
Anesthesia Evaluation  Patient identified by MRN, date of birth, ID band Patient awake    Reviewed: Allergy & Precautions, NPO status , Patient's Chart, lab work & pertinent test results, reviewed documented beta blocker date and time   History of Anesthesia Complications (+) Family history of anesthesia reaction and history of anesthetic complications (mother with nausea)  Airway Mallampati: III  TM Distance: >3 FB Neck ROM: Full    Dental  (+) Teeth Intact, Dental Advisory Given   Pulmonary neg shortness of breath, neg sleep apnea, neg COPD, neg recent URI, Current Smoker,    Pulmonary exam normal breath sounds clear to auscultation       Cardiovascular Exercise Tolerance: Good hypertension, Pt. on medications and Pt. on home beta blockers (-) angina+CHF (EF 35-40%, last hospitalized 08/31/2015)  (-) Past MI, (-) Cardiac Stents, (-) CABG, (-) Orthopnea, (-) PND and (-) DOE  Rhythm:Regular Rate:Normal  EKG 08/31/2015: Sinus bradycardia, incomplete LBBB  RHC/LHC 08/14/2015: Conclusion  Widely patent coronary arteries. Minimal luminal irregularities are noted in the LAD. Significant left ventricular systolic dysfunction with estimated ejection fraction 25-30%. Normal right heart pressures and left ventricular filling pressures with pulmonary wedge mean, 12 mmHg. Left ventricular end-diastolic pressure 8 mmHg.  TTE 08/13/2015: Study Conclusions - Left ventricle: The cavity size was moderately dilated. There was mild concentric hypertrophy. Systolic function was moderately reduced. The estimated ejection fraction was in the range of 35% to 40%. Moderate diffuse hypokinesis. Features are consistent with a pseudonormal left ventricular filling pattern, with concomitant abnormal relaxation and increased filling pressure (grade 2 diastolic dysfunction). Doppler parameters are consistent with high ventricular filling pressure. - Aortic valve:  There was trivial regurgitation. - Mitral valve: There was mild regurgitation. - Left atrium: The atrium was mildly dilated.     Neuro/Psych negative neurological ROS     GI/Hepatic Neg liver ROS, Bowel prep,Esophageal atresia   Endo/Other  negative endocrine ROS  Renal/GU negative Renal ROS     Musculoskeletal   Abdominal   Peds  Hematology negative hematology ROS (+)   Anesthesia Other Findings   Reproductive/Obstetrics                            Anesthesia Physical Anesthesia Plan  ASA: III  Anesthesia Plan: MAC   Post-op Pain Management:    Induction: Intravenous  Airway Management Planned: Natural Airway and Nasal Cannula  Additional Equipment:   Intra-op Plan:   Post-operative Plan:   Informed Consent: I have reviewed the patients History and Physical, chart, labs and discussed the procedure including the risks, benefits and alternatives for the proposed anesthesia with the patient or authorized representative who has indicated his/her understanding and acceptance.   Dental advisory given  Plan Discussed with: CRNA  Anesthesia Plan Comments: (Patient had ST depressions and T wave inversions on 3-lead EKG. Patient denies SOB, chest pain, and syncopal-like symptoms. 12-lead EKG ordered and shows T-wave inversions in V5 that are consistent with previous T-wave inversions seen on EKG on 08/12/15. No significant ST changes. LHC from 08/14/2015 with widely patent coronary arteries.)     Anesthesia Quick Evaluation

## 2015-12-11 ENCOUNTER — Encounter (HOSPITAL_COMMUNITY): Payer: Self-pay

## 2015-12-11 ENCOUNTER — Ambulatory Visit (HOSPITAL_COMMUNITY): Payer: 59 | Admitting: Anesthesiology

## 2015-12-11 ENCOUNTER — Encounter (HOSPITAL_COMMUNITY)
Admission: RE | Disposition: A | Discharge status: Home / Self Care | Payer: Self-pay | Source: Ambulatory Visit | Attending: Gastroenterology

## 2015-12-11 ENCOUNTER — Ambulatory Visit (HOSPITAL_COMMUNITY)
Admission: RE | Admit: 2015-12-11 | Discharge: 2015-12-11 | Disposition: A | Discharge status: Home / Self Care | Payer: 59 | Source: Ambulatory Visit | Attending: Gastroenterology | Admitting: Gastroenterology

## 2015-12-11 DIAGNOSIS — Z1212 Encounter for screening for malignant neoplasm of rectum: Secondary | ICD-10-CM | POA: Diagnosis not present

## 2015-12-11 DIAGNOSIS — Z800 Family history of malignant neoplasm of digestive organs: Secondary | ICD-10-CM

## 2015-12-11 DIAGNOSIS — Z79899 Other long term (current) drug therapy: Secondary | ICD-10-CM | POA: Diagnosis not present

## 2015-12-11 DIAGNOSIS — Z1211 Encounter for screening for malignant neoplasm of colon: Secondary | ICD-10-CM | POA: Insufficient documentation

## 2015-12-11 DIAGNOSIS — F17200 Nicotine dependence, unspecified, uncomplicated: Secondary | ICD-10-CM | POA: Insufficient documentation

## 2015-12-11 DIAGNOSIS — K5730 Diverticulosis of large intestine without perforation or abscess without bleeding: Secondary | ICD-10-CM | POA: Insufficient documentation

## 2015-12-11 DIAGNOSIS — I429 Cardiomyopathy, unspecified: Secondary | ICD-10-CM | POA: Diagnosis not present

## 2015-12-11 DIAGNOSIS — I110 Hypertensive heart disease with heart failure: Secondary | ICD-10-CM | POA: Diagnosis not present

## 2015-12-11 DIAGNOSIS — I509 Heart failure, unspecified: Secondary | ICD-10-CM | POA: Insufficient documentation

## 2015-12-11 HISTORY — PX: COLONOSCOPY WITH PROPOFOL: SHX5780

## 2015-12-11 SURGERY — COLONOSCOPY WITH PROPOFOL
Anesthesia: Monitor Anesthesia Care

## 2015-12-11 MED ORDER — SODIUM CHLORIDE 0.9 % IV SOLN: INTRAVENOUS | Status: DC

## 2015-12-11 MED ORDER — LACTATED RINGERS IV SOLN
INTRAVENOUS | Status: DC | PRN
  Administered 2015-12-11: 11:00:00 via INTRAVENOUS

## 2015-12-11 MED ORDER — PROMETHAZINE HCL 25 MG/ML IJ SOLN: 6.2500 mg | INTRAMUSCULAR | Status: DC | PRN

## 2015-12-11 MED ORDER — FENTANYL CITRATE (PF) 100 MCG/2ML IJ SOLN: 25.0000 ug | INTRAMUSCULAR | Status: DC | PRN

## 2015-12-11 MED ORDER — PROPOFOL 10 MG/ML IV BOLUS
INTRAVENOUS | Status: AC
  Filled 2015-12-11: qty 40

## 2015-12-11 MED ORDER — PROPOFOL 10 MG/ML IV BOLUS
INTRAVENOUS | Status: DC | PRN
  Administered 2015-12-11 (×2): 20 mg via INTRAVENOUS
  Administered 2015-12-11: 40 mg via INTRAVENOUS
  Administered 2015-12-11 (×3): 20 mg via INTRAVENOUS
  Administered 2015-12-11: 40 mg via INTRAVENOUS
  Administered 2015-12-11 (×2): 10 mg via INTRAVENOUS
  Administered 2015-12-11: 40 mg via INTRAVENOUS
  Administered 2015-12-11 (×3): 20 mg via INTRAVENOUS

## 2015-12-11 SURGICAL SUPPLY — 21 items

## 2015-12-11 NOTE — Transfer of Care (Signed)
Immediate Anesthesia Transfer of Care Note  Patient: Rachel Kane  Procedure(s) Performed: Procedure(s): COLONOSCOPY WITH PROPOFOL (N/A)  Patient Location: PACU  Anesthesia Type:MAC  Level of Consciousness: awake, alert  and oriented  Airway & Oxygen Therapy: Patient Spontanous Breathing and Patient connected to nasal cannula oxygen  Post-op Assessment: Report given to RN and Post -op Vital signs reviewed and stable  Post vital signs: Reviewed and stable  Last Vitals:  Vitals:   12/11/15 1043  BP: (!) 154/67  Pulse: 64  Resp: 16  Temp: 36.7 C    Last Pain:  Vitals:   12/11/15 1043  TempSrc: Oral         Complications: No apparent anesthesia complications

## 2015-12-11 NOTE — H&P (Signed)
History:  This patient presents for endoscopic testing for colon cancer screening.  Rachel Kane Referring physician: Thomasene Lot, MD  Past Medical History: Past Medical History:  Diagnosis Date  . 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  . Essential hypertension    a. Dx @ least 20 years ago.  . Family history of adverse reaction to anesthesia    mother has nausea   . History of blood transfusion   . History of pneumonia    a. 2000: required chest tube.  . Nonischemic cardiomyopathy (HCC)    a. 08/2015: EF 25-30% by cath, cors widely patent.  . Tobacco abuse      Past Surgical History: Past Surgical History:  Procedure Laterality Date  . CARDIAC CATHETERIZATION N/A 08/14/2015   Procedure: Right/Left Heart Cath and Coronary Angiography;  Surgeon: Lyn Records, MD;  Location: [facility] INVASIVE CV LAB;  Service: Cardiovascular;  Laterality: N/A;  . ESOPHAGUS SURGERY     a. 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  . TONSILLECTOMY    . TUBAL LIGATION      Allergies: Allergies  Allergen Reactions  . Amoxicillin Hives  . Sulfa Antibiotics Other (See Comments)    Childhood allergy-unknown    Outpatient Meds: No current facility-administered medications for this encounter.       ___________________________________________________________________ Objective   Exam:  There were no vitals taken for this visit.   CV: RRR without murmur, S1/S2, no JVD, no peripheral edema  Resp: clear to auscultation bilaterally, normal RR and effort noted  GI: soft, no tenderness, with active bowel sounds. No guarding or palpable organomegaly noted.  Neuro: awake, alert and oriented x 3. Normal gross motor function and fluent speech   Assessment:  Screening   Plan:  colonoscopy   [name]

## 2015-12-11 NOTE — Anesthesia Postprocedure Evaluation (Signed)
Anesthesia Post Note  Patient: Rachel Kane  Procedure(s) Performed: Procedure(s) (LRB): COLONOSCOPY WITH PROPOFOL (N/A)  Patient location during evaluation: PACU Anesthesia Type: MAC Level of consciousness: awake and alert Pain management: pain level controlled Vital Signs Assessment: post-procedure vital signs reviewed and stable Respiratory status: spontaneous breathing, nonlabored ventilation and respiratory function stable Cardiovascular status: stable and blood pressure returned to baseline Anesthetic complications: no    Last Vitals:  Vitals:   12/11/15 1120 12/11/15 1130  BP: (!) 156/72 (!) 176/82  Pulse: 61 61  Resp: (!) 22 15  Temp:      Last Pain:  Vitals:   12/11/15 1114  TempSrc: Oral                 Linton Rump

## 2015-12-11 NOTE — Op Note (Signed)
[facility] Patient Name: Rachel Kane Procedure Date: 12/11/2015 MRN: [id number] Attending MD: Starr Lake. Myrtie Neither , MD Date of Birth: Dec 11, 1960 CSN: [id number] Age: 55 Admit Type: Outpatient Procedure:                Colonoscopy Indications:              Screening for colorectal malignant neoplasm, This                            is the patient's first colonoscopy Providers:                Sherilyn Cooter L. Myrtie Neither, MD, Jacquiline Doe, RN, Beryle Beams, Technician, Albertina Senegal. Alday CRNA, CRNA Referring MD:              Medicines:                Monitored Anesthesia Care Complications:            No immediate complications. Estimated Blood Loss:     Estimated blood loss: none. Procedure:                Pre-Anesthesia Assessment:                           - Prior to the procedure, a History and Physical                            was performed, and patient medications and                            allergies were reviewed. The patient's tolerance of                            previous anesthesia was also reviewed. The risks                            and benefits of the procedure and the sedation                            options and risks were discussed with the patient.                            All questions were answered, and informed consent                            was obtained. Prior Anticoagulants: The patient has                            taken no previous anticoagulant or antiplatelet                            agents. ASA Grade Assessment: III - A patient with  severe systemic disease. After reviewing the risks                            and benefits, the patient was deemed in                            satisfactory condition to undergo the procedure.                           After obtaining informed consent, the colonoscope                            was passed under direct vision. Throughout the          procedure, the patient's blood pressure, pulse, and                            oxygen saturations were monitored continuously. The                            EC-[id number] ([id number]) scope was introduced through                            the anus and advanced to the the cecum, identified                            by appendiceal orifice and ileocecal valve. The                            colonoscopy was performed without difficulty. The                            patient tolerated the procedure well. The quality                            of the bowel preparation was excellent. The                            ileocecal valve, appendiceal orifice, and rectum                            were photographed. The quality of the bowel                            preparation was evaluated using the BBPS [facility]                            Bowel Preparation Scale) with scores of: Right                            Colon = 3, Transverse Colon = 3 and Left Colon = 3                            (entire mucosa seen well with no  residual staining,                            small fragments of stool or opaque liquid). The                            total BBPS score equals 9. The bowel preparation                            used was SUPREP. Scope In: 10:52:59 AM Scope Out: 11:07:05 AM Scope Withdrawal Time: 0 hours 8 minutes 51 seconds  Total Procedure Duration: 0 hours 14 minutes 6 seconds  Findings:      The perianal and digital rectal examinations were normal.      Multiple diverticula were found in the left colon.      The exam was otherwise without abnormality on direct and retroflexion       views. Impression:               - Diverticulosis in the left colon.                           - The examination was otherwise normal on direct                            and retroflexion views.                           - No specimens collected. Moderate Sedation:      MAC sedation used Recommendation:            - Patient has a contact number available for                            emergencies. The signs and symptoms of potential                            delayed complications were discussed with the                            patient. Return to normal activities tomorrow.                            Written discharge instructions were provided to the                            patient.                           - Resume previous diet.                           - Continue present medications.                           - Repeat colonoscopy in 10 years for screening  purposes. Procedure Code(s):        --- Professional ---                           [id number], Colonoscopy, flexible; diagnostic, including                            collection of specimen(s) by brushing or washing,                            when performed (separate procedure) Diagnosis Code(s):        --- Professional ---                           Z12.11, Encounter for screening for malignant                            neoplasm of colon                           K57.30, Diverticulosis of large intestine without                            perforation or abscess without bleeding CPT copyright 2016 American Medical Association. All rights reserved. The codes documented in this report are preliminary and upon coder review may  be revised to meet current compliance requirements. Henry L. Myrtie Neitheranis, MD 12/11/2015 11:11:06 AM This report has been signed electronically. Number of Addenda: 0

## 2015-12-11 NOTE — Discharge Instructions (Signed)
YOU HAD AN ENDOSCOPIC PROCEDURE TODAY: Refer to the procedure report and other information in the discharge instructions given to you for any specific questions about what was found during the examination. If this information does not answer your questions, please call [facility] office at [phone] to clarify.  ° °YOU SHOULD EXPECT: Some feelings of bloating in the abdomen. Passage of more gas than usual. Walking can help get rid of the air that was put into your GI tract during the procedure and reduce the bloating. If you had a lower endoscopy (such as a colonoscopy or flexible sigmoidoscopy) you may notice spotting of blood in your stool or on the toilet paper. Some abdominal soreness may be present for a day or two, also. ° °DIET: Your first meal following the procedure should be a light meal and then it is ok to progress to your normal diet. A half-sandwich or bowl of soup is an example of a good first meal. Heavy or fried foods are harder to digest and may make you feel nauseous or bloated. Drink plenty of fluids but you should avoid alcoholic beverages for 24 hours. If you had a esophageal dilation, please see attached instructions for diet.   ° °ACTIVITY: Your care partner should take you home directly after the procedure. You should plan to take it easy, moving slowly for the rest of the day. You can resume normal activity the day after the procedure however YOU SHOULD NOT DRIVE, use power tools, machinery or perform tasks that involve climbing or major physical exertion for 24 hours (because of the sedation medicines used during the test).  ° °SYMPTOMS TO REPORT IMMEDIATELY: °A gastroenterologist can be reached at any hour. Please call [phone]  for any of the following symptoms:  °Following lower endoscopy (colonoscopy, flexible sigmoidoscopy) °Excessive amounts of blood in the stool  °Significant tenderness, worsening of abdominal pains  °Swelling of the abdomen that is new, acute  °Fever of 100° or  higher  °Following upper endoscopy (EGD, EUS, ERCP, esophageal dilation) °Vomiting of blood or coffee ground material  °New, significant abdominal pain  °New, significant chest pain or pain under the shoulder blades  °Painful or persistently difficult swallowing  °New shortness of breath  °Black, tarry-looking or red, bloody stools ° °FOLLOW UP:  °If any biopsies were taken you will be contacted by phone or by letter within the next 1-3 weeks. Call [phone]  if you have not heard about the biopsies in 3 weeks.  °Please also call with any specific questions about appointments or follow up tests. ° °

## 2015-12-11 NOTE — Interval H&P Note (Signed)
History and Physical Interval Note:  12/11/2015 10:12 AM  Rachel Kane  has presented today for surgery, with the diagnosis of family history of colon cancer  The various methods of treatment have been discussed with the patient and family. After consideration of risks, benefits and other options for treatment, the patient has consented to  Procedure(s): COLONOSCOPY WITH PROPOFOL (N/A) as a surgical intervention .  The patient's history has been reviewed, patient examined, no change in status, stable for surgery.  I have reviewed the patient's chart and labs.  Questions were answered to the patient's satisfaction.     Rachel Kane

## 2015-12-14 ENCOUNTER — Encounter (HOSPITAL_COMMUNITY): Payer: Self-pay | Admitting: Gastroenterology

## 2016-01-13 ENCOUNTER — Other Ambulatory Visit: Payer: Self-pay | Admitting: Internal Medicine

## 2016-01-14 ENCOUNTER — Ambulatory Visit: Payer: 59 | Admitting: Interventional Cardiology

## 2016-02-09 ENCOUNTER — Encounter: Payer: Self-pay | Admitting: Interventional Cardiology

## 2016-02-09 ENCOUNTER — Ambulatory Visit (INDEPENDENT_AMBULATORY_CARE_PROVIDER_SITE_OTHER): Payer: 59 | Admitting: Interventional Cardiology

## 2016-02-09 VITALS — BP 182/90 | HR 78 | Ht 64.0 in | Wt 175.1 lb

## 2016-02-09 DIAGNOSIS — E782 Mixed hyperlipidemia: Secondary | ICD-10-CM

## 2016-02-09 DIAGNOSIS — Z720 Tobacco use: Secondary | ICD-10-CM

## 2016-02-09 DIAGNOSIS — I10 Essential (primary) hypertension: Secondary | ICD-10-CM | POA: Diagnosis not present

## 2016-02-09 DIAGNOSIS — I428 Other cardiomyopathies: Secondary | ICD-10-CM

## 2016-02-09 MED ORDER — CARVEDILOL 6.25 MG PO TABS: 6.2500 mg | ORAL_TABLET | Freq: Two times a day (BID) | ORAL | 1 refills | Status: DC

## 2016-02-09 MED ORDER — NICOTINE 21 MG/24HR TD PT24: 21.0000 mg | MEDICATED_PATCH | Freq: Every day | TRANSDERMAL | 0 refills | Status: DC

## 2016-02-09 NOTE — Patient Instructions (Signed)
**Note De-Identified Joselynne Killam Obfuscation** Medication Instructions:  Increase Carvedilol to 6.25 mg twice daily. Start using Nicoderm 21 mg patch. All other medications remain the same.  Labwork: None  Testing/Procedures: None  Follow-Up: Your physician wants you to follow-up in: 6 months. You will receive a reminder letter in the mail two months in advance. If you don't receive a letter, please call our office to schedule the follow-up appointment.     If you need a refill on your cardiac medications before your next appointment, please call your pharmacy.

## 2016-02-09 NOTE — Progress Notes (Signed)
Cardiology Office Note   Date:  02/09/2016   ID:  Rachel Kane, DOB [birth year], MRN [id number]  PCP:  Thomasene Lot, MD    No chief complaint on file.    Wt Readings from Last 3 Encounters:  02/09/16 175 lb 1.9 oz (79.4 kg)  12/04/15 166 lb (75.3 kg)  09/22/15 160 lb (72.6 kg)       History of Present Illness: Rachel Kane is a 55 y.o. female  who was diagnosed with a nonischemic cardiomyopathy in the first half 2017. She presented with severe hypertension. Ejection fraction was around 30%. Cardiac catheterization showed no significant coronary artery disease but only minimal irregularities. She was started on lisinopril, Coreg and Lasix.  Ejection fraction by cath was thought to be somewhat higher, around 35-40%. She has not had a repeat echocardiogram.  She was seen in the form the hypertension clinic in July 2017 and home blood pressures were controlled.  BP at home has been in the 130-140 systolic range.  She had a 118 systolic reading once.      Past Medical History:  Diagnosis Date  . 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  . Essential hypertension    a. Dx @ least 20 years ago.  . Family history of adverse reaction to anesthesia    mother has nausea   . History of blood transfusion   . History of pneumonia    a. 2000: required chest tube.  . Nonischemic cardiomyopathy (HCC)    a. 08/2015: EF 25-30% by cath, cors widely patent.  . Tobacco abuse     Past Surgical History:  Procedure Laterality Date  . CARDIAC CATHETERIZATION N/A 08/14/2015   Procedure: Right/Left Heart Cath and Coronary Angiography;  Surgeon: Lyn Records, MD;  Location: [facility] INVASIVE CV LAB;  Service: Cardiovascular;  Laterality: N/A;  . COLONOSCOPY WITH PROPOFOL N/A 12/11/2015   Procedure: COLONOSCOPY WITH PROPOFOL;  Surgeon: Sherrilyn Rist, MD;  Location: [facility] ENDOSCOPY;  Service: Gastroenterology;  Laterality: N/A;  . ESOPHAGUS SURGERY      a. 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  . TONSILLECTOMY    . TUBAL LIGATION       Current Outpatient Prescriptions  Medication Sig Dispense Refill  . albuterol (PROVENTIL HFA;VENTOLIN HFA) 108 (90 Base) MCG/ACT inhaler Inhale 2 puffs into the lungs every 6 (six) hours as needed for wheezing or shortness of breath. 1 Inhaler 2  . atorvastatin (LIPITOR) 40 MG tablet Take 1 tablet (40 mg total) by mouth daily at 6 PM. 30 tablet 11  . carvedilol (COREG) 3.125 MG tablet Take 1 tablet (3.125 mg total) by mouth 2 (two) times daily with a meal. 60 tablet 11  . furosemide (LASIX) 40 MG tablet TAKE ONE TABLET BY MOUTH ONCE DAILY 30 tablet 0  . lisinopril (PRINIVIL,ZESTRIL) 20 MG tablet Take 1 tablet (20 mg total) by mouth daily. 30 tablet 11  . loratadine (CLARITIN) 10 MG tablet Take 10 mg by mouth as needed for allergies.     No current facility-administered medications for this visit.     Allergies:   Amoxicillin and Sulfa antibiotics    Social History:  The patient  reports that she has been smoking Cigarettes.  She has a 9.00 pack-year smoking history. She has never used smokeless tobacco. She reports that she drinks alcohol. She reports that she does  not use drugs.   Family History:  The patient's family history includes Other in her father and mother. No early CAD   ROS:  Please see the history of present illness.   Otherwise, review of systems are positive for hand pain and back pain.   All other systems are reviewed and negative.    PHYSICAL EXAM: VS:  BP (!) 182/90   Pulse 78   Ht 5\' 4"  (1.626 m)   Wt 175 lb 1.9 oz (79.4 kg)   SpO2 99%   BMI 30.06 kg/m  , BMI Body mass index is 30.06 kg/m. GEN: Well nourished, well developed, in no acute distress  HEENT: normal  Neck: no JVD, carotid bruits, or masses Cardiac: RRR; no murmurs, rubs, or gallops,no edema  Respiratory:  clear to auscultation bilaterally,  normal work of breathing GI: soft, nontender, nondistended, + BS MS: no deformity or atrophy  Skin: warm and dry, no rash Neuro:  Strength and sensation are intact Psych: euthymic mood, full affect   Recent Labs: 08/12/2015: B Natriuretic Peptide 1,043.0; Hemoglobin 13.6; Platelets 342 08/14/2015: TSH 0.990 09/22/2015: BUN 10; Creat 0.84; Potassium 4.3; Sodium 140   Lipid Panel    Component Value Date/Time   CHOL 247 (H) 08/12/2015 1900   TRIG 76 08/12/2015 1900   HDL 72 08/12/2015 1900   CHOLHDL 3.4 08/12/2015 1900   VLDL 15 08/12/2015 1900   LDLCALC 160 (H) 08/12/2015 1900     Other studies Reviewed: Additional studies/ records that were reviewed today with results demonstrating: hospital records.   ASSESSMENT AND PLAN:  1. NICM/ chronic systolic heart failure: Increase carvedilol to 6.25 mg BID. Continue ACE-I.  EF has not been rechecked but she has no sx of CHF. Will reconsider after she has ben on the higher dose of Coreg.  EF was higher at cath. 2. Hypertension: Higher in the MDs office.  Home readings are better.  Increase beta blocker to 6.25 mg BID.  3. Tobacco abuse:  Still smoking. Willing to try a patch.  She has decreased down to a 1/2 ppd.  Try 21 mg patch. 4. Hyperlipidemia: Continue atorvastatin .  Check fasting labs.     Current medicines are reviewed at length with the patient today.  The patient concerns regarding her medicines were addressed.  The following changes have been made:  Increase Coreg.  Labs/ tests ordered today include:  No orders of the defined types were placed in this encounter.   Recommend 150 minutes/week of aerobic exercise Low fat, low carb, high fiber diet recommended  Disposition:   FU in 6 months   Signed, Lance MussJayadeep Arend Bahl, MD  02/09/2016 3:02 PM    [facility] [address], [place], [place]  [address] Phone: [phone]; Fax: [phone]

## 2016-02-09 NOTE — Addendum Note (Signed)
**Note De-Identified Tyneka Scafidi Obfuscation** Addended by: Demetrios Loll on: 02/09/2016 05:07 PM   Modules accepted: Orders

## 2016-02-12 ENCOUNTER — Other Ambulatory Visit: Payer: 59

## 2016-02-12 ENCOUNTER — Other Ambulatory Visit: Payer: Self-pay | Admitting: Internal Medicine

## 2016-03-15 ENCOUNTER — Other Ambulatory Visit: Payer: Self-pay | Admitting: Internal Medicine

## 2016-04-08 ENCOUNTER — Telehealth: Payer: Self-pay | Admitting: Interventional Cardiology

## 2016-04-08 ENCOUNTER — Ambulatory Visit (INDEPENDENT_AMBULATORY_CARE_PROVIDER_SITE_OTHER): Payer: 59 | Admitting: Internal Medicine

## 2016-04-08 ENCOUNTER — Encounter: Payer: Self-pay | Admitting: Internal Medicine

## 2016-04-08 VITALS — BP 168/88 | HR 72 | Temp 98.2°F | Ht 64.0 in | Wt 180.2 lb

## 2016-04-08 DIAGNOSIS — F17211 Nicotine dependence, cigarettes, in remission: Secondary | ICD-10-CM

## 2016-04-08 DIAGNOSIS — Z79899 Other long term (current) drug therapy: Secondary | ICD-10-CM

## 2016-04-08 DIAGNOSIS — J069 Acute upper respiratory infection, unspecified: Secondary | ICD-10-CM | POA: Diagnosis not present

## 2016-04-08 DIAGNOSIS — I10 Essential (primary) hypertension: Secondary | ICD-10-CM

## 2016-04-08 MED ORDER — ALBUTEROL SULFATE HFA 108 (90 BASE) MCG/ACT IN AERS: 2.0000 | INHALATION_SPRAY | Freq: Four times a day (QID) | RESPIRATORY_TRACT | 2 refills | Status: AC | PRN

## 2016-04-08 MED ORDER — CARVEDILOL 6.25 MG PO TABS: 6.2500 mg | ORAL_TABLET | Freq: Two times a day (BID) | ORAL | 3 refills | Status: DC

## 2016-04-08 MED ORDER — FLUTICASONE PROPIONATE 50 MCG/ACT NA SUSP: 2.0000 | Freq: Every day | NASAL | 2 refills | Status: AC

## 2016-04-08 MED ORDER — LISINOPRIL 20 MG PO TABS: 20.0000 mg | ORAL_TABLET | Freq: Every day | ORAL | 3 refills | Status: AC

## 2016-04-08 MED ORDER — FUROSEMIDE 40 MG PO TABS: 40.0000 mg | ORAL_TABLET | Freq: Every day | ORAL | 3 refills | Status: AC

## 2016-04-08 MED ORDER — GUAIFENESIN-DM 100-10 MG/5ML PO SYRP: 5.0000 mL | ORAL_SOLUTION | ORAL | 0 refills | Status: AC | PRN

## 2016-04-08 MED ORDER — ATORVASTATIN CALCIUM 40 MG PO TABS: 40.0000 mg | ORAL_TABLET | Freq: Every day | ORAL | 3 refills | Status: AC

## 2016-04-08 MED ORDER — GUAIFENESIN-CODEINE 100-10 MG/5ML PO SOLN: 5.0000 mL | Freq: Four times a day (QID) | ORAL | 0 refills | Status: AC | PRN

## 2016-04-08 MED ORDER — GUAIFENESIN ER 600 MG PO TB12: 600.0000 mg | ORAL_TABLET | Freq: Two times a day (BID) | ORAL | 0 refills | Status: AC

## 2016-04-08 NOTE — Assessment & Plan Note (Addendum)
BP Readings from Last 3 Encounters:  04/08/16 (!) 168/88  02/09/16 (!) 182/90  12/11/15 (!) 176/82    Lab Results  Component Value Date   NA 140 09/22/2015   K 4.3 09/22/2015   CREATININE 0.84 09/22/2015    Assessment: Blood pressure control:  Elevated Progress toward BP goal:   Improved Comments: compliant with lisinopril 20 mg QD and coreg 6.25 mg BID. Reports BP has been controlled at home, but she has not checked in several weeks.   Plan: Medications: Continue current medications. Possibly elevated due to acute illness. Encouraged patient to check at home and if remaining >140/90 to return for adjustment of medications.

## 2016-04-08 NOTE — Assessment & Plan Note (Signed)
Patient states that 4 days ago she developed a non-productive cough and sinus congestion. Today, she developed loose stools. She denies fever, chills, sore throat, nausea, vomiting, shortness of breath or wheezing. She has tried Ceraflu and airborne without relief. She has been exposed to sick contacts within her family recently. Her upper back is now hurting her with coughing.  On physical exam, she is afebrile, normal HR, normal RR, satting 98% on room air. No wheezing or rhonchi on exam.   Assessment: Viral URI, possibly flu, but outside of window for treatment  Plan: -Mucinex -Robitussin for cough -Flonase -Rest and hydration

## 2016-04-08 NOTE — Telephone Encounter (Signed)
New Message  Pt call requesting to speak with RN. Pt ask if Dr. Eldridge Dace would prescribe her medication for the flu. Please call back to discuss

## 2016-04-08 NOTE — Telephone Encounter (Signed)
The pt is advised to contact her PCP or Urgent care. She verbalized understanding.

## 2016-04-08 NOTE — Progress Notes (Signed)
    CC: Cough  HPI: [name] is a 56 y.o. female with PMHx of HTN, Chronic Combined CHF who presents to the clinic for complaint of cough and congestion.   Patient states that 4 days ago she developed a non-productive cough and sinus congestion. Today, she developed loose stools. She denies fever, chills, sore throat, nausea, vomiting, shortness of breath or wheezing. She has tried Ceraflu and airborne without relief. She has been exposed to sick contacts within her family recently. Her upper back is now hurting her with coughing.   Past Medical History:  Diagnosis Date  . 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  . Essential hypertension    a. Dx @ least 20 years ago.  . Family history of adverse reaction to anesthesia    mother has nausea   . History of blood transfusion   . History of pneumonia    a. 2000: required chest tube.  . Nonischemic cardiomyopathy (HCC)    a. 08/2015: EF 25-30% by cath, cors widely patent.  . Tobacco abuse     Review of Systems: Please see pertinent ROS reviewed in HPI and problem based charting.   Physical Exam: Vitals:   04/08/16 1424  BP: (!) 192/72  Pulse: 87  Temp: 98.2 F (36.8 C)  TempSrc: Oral  SpO2: 98%  Weight: 180 lb 3.2 oz (81.7 kg)  Height: 5\' 4"  (1.626 m)   General: Vital signs reviewed.  Patient is well-developed and well-nourished, in no acute distress and cooperative with exam.  Head: Normocephalic and atraumatic. Eyes: PERRLA, conjunctivae normal, no scleral icterus.  Ears: Normal tympanic membranes bilaterally Nose: Erythematous, edematous nasal turbinates Mouth: Normal posterior oropharynx Neck: Supple, trachea midline,+anterior cervical lymphadenopathy.  Cardiovascular: RRR, S1 normal, S2 normal, no murmurs, gallops, or rubs. Pulmonary/Chest: Clear to auscultation bilaterally, no wheezes, rales, or rhonchi. Abdominal: Soft, non-tender, non-distended, BS  + Extremities: No lower extremity edema bilaterally Skin: Warm, dry and intact. No rashes or erythema. Psychiatric: Normal mood and affect. speech and behavior is normal. Cognition and memory are normal.   Assessment & Plan:  See encounters tab for problem based medical decision making. Patient discussed with Dr. Heide Spark.

## 2016-04-08 NOTE — Patient Instructions (Addendum)
For your viral respiratory infection: -Take flonase 2 sprays each nostril once a day -Mucinex (Guaifenesin one tablet twice a day) -Use Robitussin DM for cough during the day -Use Robitussin AC (with codeine for pain and cough at night) -If your symptoms worsen or do not resolve in one week, please call the clinic  For your High Blood Pressure: -Continue to take your medications as prescribed -check your blood pressure at home -if persistently elevated above 140/90, please return to the clinic

## 2016-04-11 NOTE — Progress Notes (Signed)
Internal Medicine Clinic Attending  Case discussed with Dr. Burns at the time of the visit.  We reviewed the resident's history and exam and pertinent patient test results.  I agree with the assessment, diagnosis, and plan of care documented in the resident's note.  

## 2016-05-26 ENCOUNTER — Encounter: Payer: 59 | Admitting: Internal Medicine

## 2016-08-15 ENCOUNTER — Encounter: Payer: Self-pay | Admitting: *Deleted

## 2017-04-27 ENCOUNTER — Other Ambulatory Visit: Payer: Self-pay | Admitting: *Deleted

## 2017-04-27 DIAGNOSIS — I10 Essential (primary) hypertension: Secondary | ICD-10-CM

## 2017-04-27 MED ORDER — CARVEDILOL 6.25 MG PO TABS: 6.2500 mg | ORAL_TABLET | Freq: Two times a day (BID) | ORAL | 0 refills | Status: AC

## 2020-08-17 ENCOUNTER — Inpatient Hospital Stay (HOSPITAL_COMMUNITY): Payer: 59

## 2020-08-17 ENCOUNTER — Encounter (HOSPITAL_COMMUNITY): Payer: Self-pay

## 2020-08-17 ENCOUNTER — Inpatient Hospital Stay (HOSPITAL_COMMUNITY)
Admission: EM | Admit: 2020-08-17 | Discharge: 2020-09-04 | DRG: 917 | Disposition: E | Payer: 59 | Attending: Internal Medicine | Admitting: Internal Medicine

## 2020-08-17 ENCOUNTER — Emergency Department (HOSPITAL_COMMUNITY): Payer: 59

## 2020-08-17 DIAGNOSIS — Z79899 Other long term (current) drug therapy: Secondary | ICD-10-CM

## 2020-08-17 DIAGNOSIS — E876 Hypokalemia: Secondary | ICD-10-CM | POA: Diagnosis present

## 2020-08-17 DIAGNOSIS — R569 Unspecified convulsions: Secondary | ICD-10-CM | POA: Insufficient documentation

## 2020-08-17 DIAGNOSIS — J9601 Acute respiratory failure with hypoxia: Secondary | ICD-10-CM | POA: Diagnosis present

## 2020-08-17 DIAGNOSIS — R4020 Unspecified coma: Secondary | ICD-10-CM | POA: Diagnosis present

## 2020-08-17 DIAGNOSIS — Z66 Do not resuscitate: Secondary | ICD-10-CM | POA: Diagnosis present

## 2020-08-17 DIAGNOSIS — I468 Cardiac arrest due to other underlying condition: Secondary | ICD-10-CM | POA: Diagnosis present

## 2020-08-17 DIAGNOSIS — Z20822 Contact with and (suspected) exposure to covid-19: Secondary | ICD-10-CM | POA: Diagnosis present

## 2020-08-17 DIAGNOSIS — T402X1A Poisoning by other opioids, accidental (unintentional), initial encounter: Principal | ICD-10-CM | POA: Diagnosis present

## 2020-08-17 DIAGNOSIS — I428 Other cardiomyopathies: Secondary | ICD-10-CM | POA: Diagnosis present

## 2020-08-17 DIAGNOSIS — G40901 Epilepsy, unspecified, not intractable, with status epilepticus: Secondary | ICD-10-CM | POA: Diagnosis present

## 2020-08-17 DIAGNOSIS — G9382 Brain death: Secondary | ICD-10-CM | POA: Diagnosis present

## 2020-08-17 DIAGNOSIS — K567 Ileus, unspecified: Secondary | ICD-10-CM

## 2020-08-17 DIAGNOSIS — I5023 Acute on chronic systolic (congestive) heart failure: Secondary | ICD-10-CM | POA: Diagnosis present

## 2020-08-17 DIAGNOSIS — J690 Pneumonitis due to inhalation of food and vomit: Secondary | ICD-10-CM | POA: Diagnosis present

## 2020-08-17 DIAGNOSIS — R739 Hyperglycemia, unspecified: Secondary | ICD-10-CM | POA: Diagnosis present

## 2020-08-17 DIAGNOSIS — I462 Cardiac arrest due to underlying cardiac condition: Secondary | ICD-10-CM | POA: Diagnosis not present

## 2020-08-17 DIAGNOSIS — I110 Hypertensive heart disease with heart failure: Secondary | ICD-10-CM | POA: Diagnosis present

## 2020-08-17 DIAGNOSIS — G40409 Other generalized epilepsy and epileptic syndromes, not intractable, without status epilepticus: Secondary | ICD-10-CM

## 2020-08-17 DIAGNOSIS — Z452 Encounter for adjustment and management of vascular access device: Secondary | ICD-10-CM

## 2020-08-17 DIAGNOSIS — G931 Anoxic brain damage, not elsewhere classified: Secondary | ICD-10-CM | POA: Diagnosis present

## 2020-08-17 DIAGNOSIS — F17200 Nicotine dependence, unspecified, uncomplicated: Secondary | ICD-10-CM | POA: Diagnosis present

## 2020-08-17 DIAGNOSIS — J439 Emphysema, unspecified: Secondary | ICD-10-CM | POA: Diagnosis present

## 2020-08-17 DIAGNOSIS — Z515 Encounter for palliative care: Secondary | ICD-10-CM | POA: Diagnosis not present

## 2020-08-17 DIAGNOSIS — I469 Cardiac arrest, cause unspecified: Secondary | ICD-10-CM | POA: Diagnosis present

## 2020-08-17 DIAGNOSIS — N179 Acute kidney failure, unspecified: Secondary | ICD-10-CM | POA: Diagnosis present

## 2020-08-17 DIAGNOSIS — J9602 Acute respiratory failure with hypercapnia: Secondary | ICD-10-CM | POA: Diagnosis not present

## 2020-08-17 DIAGNOSIS — Z9289 Personal history of other medical treatment: Secondary | ICD-10-CM

## 2020-08-17 LAB — CBC WITH DIFFERENTIAL/PLATELET
Abs Immature Granulocytes: 0.32 10*3/uL — ABNORMAL HIGH (ref 0.00–0.07)
Basophils Absolute: 0.1 10*3/uL (ref 0.0–0.1)
Basophils Relative: 0 %
Eosinophils Absolute: 0.1 10*3/uL (ref 0.0–0.5)
Eosinophils Relative: 0 %
HCT: 43.5 % (ref 36.0–46.0)
Hemoglobin: 13.9 g/dL (ref 12.0–15.0)
Immature Granulocytes: 2 %
Lymphocytes Relative: 32 %
Lymphs Abs: 4.7 10*3/uL — ABNORMAL HIGH (ref 0.7–4.0)
MCH: 32.7 pg (ref 26.0–34.0)
MCHC: 32 g/dL (ref 30.0–36.0)
MCV: 102.4 fL — ABNORMAL HIGH (ref 80.0–100.0)
Monocytes Absolute: 0.4 10*3/uL (ref 0.1–1.0)
Monocytes Relative: 3 %
Neutro Abs: 9 10*3/uL — ABNORMAL HIGH (ref 1.7–7.7)
Neutrophils Relative %: 63 %
Platelets: 268 10*3/uL (ref 150–400)
RBC: 4.25 MIL/uL (ref 3.87–5.11)
RDW: 12.1 % (ref 11.5–15.5)
WBC: 14.5 10*3/uL — ABNORMAL HIGH (ref 4.0–10.5)
nRBC: 0 % (ref 0.0–0.2)

## 2020-08-17 LAB — COMPREHENSIVE METABOLIC PANEL
ALT: 18 U/L (ref 0–44)
AST: 41 U/L (ref 15–41)
Albumin: 3 g/dL — ABNORMAL LOW (ref 3.5–5.0)
Alkaline Phosphatase: 94 U/L (ref 38–126)
Anion gap: 19 — ABNORMAL HIGH (ref 5–15)
BUN: 9 mg/dL (ref 6–20)
CO2: 15 mmol/L — ABNORMAL LOW (ref 22–32)
Calcium: 8.4 mg/dL — ABNORMAL LOW (ref 8.9–10.3)
Chloride: 102 mmol/L (ref 98–111)
Creatinine, Ser: 1.27 mg/dL — ABNORMAL HIGH (ref 0.44–1.00)
GFR, Estimated: 49 mL/min — ABNORMAL LOW (ref >60)
Glucose, Bld: 324 mg/dL — ABNORMAL HIGH (ref 70–99)
Potassium: 4.3 mmol/L (ref 3.5–5.1)
Sodium: 136 mmol/L (ref 135–145)
Total Bilirubin: 0.7 mg/dL (ref 0.3–1.2)
Total Protein: 6.3 g/dL — ABNORMAL LOW (ref 6.5–8.1)

## 2020-08-17 LAB — URINALYSIS, ROUTINE W REFLEX MICROSCOPIC
Bilirubin Urine: NEGATIVE
Glucose, UA: >=500 mg/dL — AB
Ketones, ur: NEGATIVE mg/dL
Leukocytes,Ua: NEGATIVE
Nitrite: NEGATIVE
Protein, ur: >=300 mg/dL — AB
Specific Gravity, Urine: 1.008 (ref 1.005–1.030)
pH: 7 (ref 5.0–8.0)

## 2020-08-17 LAB — I-STAT ARTERIAL BLOOD GAS, ED
Acid-base deficit: 6 mmol/L — ABNORMAL HIGH (ref 0.0–2.0)
Bicarbonate: 23.6 mmol/L (ref 20.0–28.0)
Calcium, Ion: 1.22 mmol/L (ref 1.15–1.40)
HCT: 41 % (ref 36.0–46.0)
Hemoglobin: 13.9 g/dL (ref 12.0–15.0)
O2 Saturation: 94 %
Patient temperature: 98.9
Potassium: 4 mmol/L (ref 3.5–5.1)
Sodium: 139 mmol/L (ref 135–145)
TCO2: 26 mmol/L (ref 22–32)
pCO2 arterial: 67.1 mmHg (ref 32.0–48.0)
pH, Arterial: 7.156 — CL (ref 7.350–7.450)
pO2, Arterial: 93 mmHg (ref 83.0–108.0)

## 2020-08-17 LAB — RESP PANEL BY RT-PCR (FLU A&B, COVID) ARPGX2
Influenza A by PCR: NEGATIVE
Influenza B by PCR: NEGATIVE
SARS Coronavirus 2 by RT PCR: NEGATIVE

## 2020-08-17 LAB — GLUCOSE, CAPILLARY
Glucose-Capillary: 122 mg/dL — ABNORMAL HIGH (ref 70–99)
Glucose-Capillary: 97 mg/dL (ref 70–99)
Glucose-Capillary: 124 mg/dL — ABNORMAL HIGH (ref 70–99)
Glucose-Capillary: 116 mg/dL — ABNORMAL HIGH (ref 70–99)

## 2020-08-17 LAB — POCT I-STAT 7, (LYTES, BLD GAS, ICA,H+H)
Acid-base deficit: 3 mmol/L — ABNORMAL HIGH (ref 0.0–2.0)
Acid-base deficit: 2 mmol/L (ref 0.0–2.0)
Bicarbonate: 25.1 mmol/L (ref 20.0–28.0)
Bicarbonate: 24.3 mmol/L (ref 20.0–28.0)
Calcium, Ion: 1.21 mmol/L (ref 1.15–1.40)
Calcium, Ion: 1.15 mmol/L (ref 1.15–1.40)
HCT: 38 % (ref 36.0–46.0)
HCT: 37 % (ref 36.0–46.0)
Hemoglobin: 12.9 g/dL (ref 12.0–15.0)
Hemoglobin: 12.6 g/dL (ref 12.0–15.0)
O2 Saturation: 93 %
O2 Saturation: 100 %
Patient temperature: 99
Patient temperature: 37.5
Potassium: 3.8 mmol/L (ref 3.5–5.1)
Potassium: 3.8 mmol/L (ref 3.5–5.1)
Sodium: 140 mmol/L (ref 135–145)
Sodium: 140 mmol/L (ref 135–145)
TCO2: 27 mmol/L (ref 22–32)
TCO2: 26 mmol/L (ref 22–32)
pCO2 arterial: 58.2 mmHg — ABNORMAL HIGH (ref 32.0–48.0)
pCO2 arterial: 47.8 mmHg (ref 32.0–48.0)
pH, Arterial: 7.243 — ABNORMAL LOW (ref 7.350–7.450)
pH, Arterial: 7.316 — ABNORMAL LOW (ref 7.350–7.450)
pO2, Arterial: 82 mmHg — ABNORMAL LOW (ref 83.0–108.0)
pO2, Arterial: 358 mmHg — ABNORMAL HIGH (ref 83.0–108.0)

## 2020-08-17 LAB — CBC
HCT: 42.4 % (ref 36.0–46.0)
Hemoglobin: 13.8 g/dL (ref 12.0–15.0)
MCH: 32.4 pg (ref 26.0–34.0)
MCHC: 32.5 g/dL (ref 30.0–36.0)
MCV: 99.5 fL (ref 80.0–100.0)
Platelets: 184 10*3/uL (ref 150–400)
RBC: 4.26 MIL/uL (ref 3.87–5.11)
RDW: 12.3 % (ref 11.5–15.5)
WBC: 11.9 10*3/uL — ABNORMAL HIGH (ref 4.0–10.5)
nRBC: 0 % (ref 0.0–0.2)

## 2020-08-17 LAB — BETA-HYDROXYBUTYRIC ACID: Beta-Hydroxybutyric Acid: 0.09 mmol/L (ref 0.05–0.27)

## 2020-08-17 LAB — I-STAT CHEM 8, ED
BUN: 7 mg/dL (ref 6–20)
Calcium, Ion: 1.09 mmol/L — ABNORMAL LOW (ref 1.15–1.40)
Chloride: 105 mmol/L (ref 98–111)
Creatinine, Ser: 1.1 mg/dL — ABNORMAL HIGH (ref 0.44–1.00)
Glucose, Bld: 318 mg/dL — ABNORMAL HIGH (ref 70–99)
HCT: 42 % (ref 36.0–46.0)
Hemoglobin: 14.3 g/dL (ref 12.0–15.0)
Potassium: 4.4 mmol/L (ref 3.5–5.1)
Sodium: 138 mmol/L (ref 135–145)
TCO2: 18 mmol/L — ABNORMAL LOW (ref 22–32)

## 2020-08-17 LAB — ECHOCARDIOGRAM COMPLETE
AR max vel: 2.3 cm2
AV Area VTI: 2.34 cm2
AV Area mean vel: 2.19 cm2
AV Mean grad: 3 mmHg
AV Peak grad: 6.5 mmHg
Ao pk vel: 1.27 m/s
Area-P 1/2: 4.8 cm2
Height: 64 in
S' Lateral: 4.8 cm
Weight: 2880 oz

## 2020-08-17 LAB — TROPONIN I (HIGH SENSITIVITY)
Troponin I (High Sensitivity): 45 ng/L — ABNORMAL HIGH (ref <18)
Troponin I (High Sensitivity): 138 ng/L (ref <18)

## 2020-08-17 LAB — RAPID URINE DRUG SCREEN, HOSP PERFORMED
Amphetamines: NOT DETECTED
Barbiturates: NOT DETECTED
Benzodiazepines: POSITIVE — AB
Cocaine: NOT DETECTED
Opiates: POSITIVE — AB
Tetrahydrocannabinol: NOT DETECTED

## 2020-08-17 LAB — ETHANOL: Alcohol, Ethyl (B): <10 mg/dL (ref <10)

## 2020-08-17 LAB — LACTIC ACID, PLASMA
Lactic Acid, Venous: 10.2 mmol/L (ref 0.5–1.9)
Lactic Acid, Venous: 1.9 mmol/L (ref 0.5–1.9)
Lactic Acid, Venous: 1.5 mmol/L (ref 0.5–1.9)

## 2020-08-17 LAB — CREATININE, SERUM
Creatinine, Ser: 1.24 mg/dL — ABNORMAL HIGH (ref 0.44–1.00)
GFR, Estimated: 51 mL/min — ABNORMAL LOW (ref >60)

## 2020-08-17 LAB — PHOSPHORUS: Phosphorus: 5.2 mg/dL — ABNORMAL HIGH (ref 2.5–4.6)

## 2020-08-17 LAB — BRAIN NATRIURETIC PEPTIDE: B Natriuretic Peptide: 818.7 pg/mL — ABNORMAL HIGH (ref 0.0–100.0)

## 2020-08-17 LAB — PROTIME-INR
INR: 1.1 (ref 0.8–1.2)
Prothrombin Time: 14.1 seconds (ref 11.4–15.2)

## 2020-08-17 LAB — MAGNESIUM: Magnesium: 2.2 mg/dL (ref 1.7–2.4)

## 2020-08-17 LAB — MRSA NEXT GEN BY PCR, NASAL: MRSA by PCR Next Gen: NOT DETECTED

## 2020-08-17 LAB — HIV ANTIBODY (ROUTINE TESTING W REFLEX): HIV Screen 4th Generation wRfx: NONREACTIVE

## 2020-08-17 MED ORDER — SODIUM CHLORIDE 0.9 % IV SOLN
2.0000 g | INTRAVENOUS | Status: DC
  Filled 2020-08-17: qty 20

## 2020-08-17 MED ORDER — HEPARIN SODIUM (PORCINE) 5000 UNIT/ML IJ SOLN
5000.0000 [IU] | Freq: Three times a day (TID) | INTRAMUSCULAR | Status: DC
  Administered 2020-08-17 – 2020-08-19 (×7): 5000 [IU] via SUBCUTANEOUS
  Filled 2020-08-17 (×7): qty 1

## 2020-08-17 MED ORDER — SODIUM CHLORIDE 0.9 % IV SOLN
INTRAVENOUS | Status: DC | PRN
  Administered 2020-08-17: 1000 mL via INTRAVENOUS

## 2020-08-17 MED ORDER — LORAZEPAM 2 MG/ML IJ SOLN: 4.0000 mg | Freq: Once | INTRAMUSCULAR | Status: AC

## 2020-08-17 MED ORDER — ARFORMOTEROL TARTRATE 15 MCG/2ML IN NEBU
15.0000 ug | INHALATION_SOLUTION | Freq: Two times a day (BID) | RESPIRATORY_TRACT | Status: DC
  Administered 2020-08-17 – 2020-08-19 (×5): 15 ug via RESPIRATORY_TRACT
  Filled 2020-08-17 (×5): qty 2

## 2020-08-17 MED ORDER — LEVETIRACETAM IN NACL 1000 MG/100ML IV SOLN
1000.0000 mg | Freq: Two times a day (BID) | INTRAVENOUS | Status: DC
  Administered 2020-08-17 – 2020-08-19 (×5): 1000 mg via INTRAVENOUS
  Filled 2020-08-17 (×5): qty 100

## 2020-08-17 MED ORDER — SODIUM CHLORIDE 0.9% FLUSH: 10.0000 mL | INTRAVENOUS | Status: DC | PRN

## 2020-08-17 MED ORDER — PROPOFOL 1000 MG/100ML IV EMUL: 5.0000 ug/kg/min | INTRAVENOUS | Status: DC

## 2020-08-17 MED ORDER — REVEFENACIN 175 MCG/3ML IN SOLN
175.0000 ug | Freq: Every day | RESPIRATORY_TRACT | Status: DC
  Administered 2020-08-18 – 2020-08-19 (×2): 175 ug via RESPIRATORY_TRACT
  Filled 2020-08-17 (×4): qty 3

## 2020-08-17 MED ORDER — SODIUM CHLORIDE 0.9% FLUSH
10.0000 mL | Freq: Two times a day (BID) | INTRAVENOUS | Status: DC
  Administered 2020-08-17 (×2): 10 mL
  Administered 2020-08-18: 20 mL
  Administered 2020-08-18 – 2020-08-19 (×2): 10 mL

## 2020-08-17 MED ORDER — LACTATED RINGERS IV BOLUS
1000.0000 mL | Freq: Once | INTRAVENOUS | Status: AC
  Administered 2020-08-17: 1000 mL via INTRAVENOUS

## 2020-08-17 MED ORDER — FENTANYL CITRATE (PF) 100 MCG/2ML IJ SOLN
50.0000 ug | Freq: Once | INTRAMUSCULAR | Status: DC
Start: 2020-08-17 — End: 2020-08-20

## 2020-08-17 MED ORDER — PROPOFOL 1000 MG/100ML IV EMUL
INTRAVENOUS | Status: AC
  Administered 2020-08-17: 20 ug/kg/min via INTRAVENOUS
  Filled 2020-08-17: qty 100

## 2020-08-17 MED ORDER — POLYETHYLENE GLYCOL 3350 17 G PO PACK: 17.0000 g | PACK | Freq: Every day | ORAL | Status: DC | PRN

## 2020-08-17 MED ORDER — MIDAZOLAM HCL 5 MG/5ML IJ SOLN
INTRAMUSCULAR | Status: AC | PRN
  Administered 2020-08-17: 2.5 mg via INTRAVENOUS

## 2020-08-17 MED ORDER — METHYLPREDNISOLONE SODIUM SUCC 125 MG IJ SOLR
60.0000 mg | Freq: Two times a day (BID) | INTRAMUSCULAR | Status: DC
  Administered 2020-08-17 – 2020-08-18 (×3): 60 mg via INTRAVENOUS
  Filled 2020-08-17 (×3): qty 2

## 2020-08-17 MED ORDER — FENTANYL 2500MCG IN NS 250ML (10MCG/ML) PREMIX INFUSION
50.0000 ug/h | INTRAVENOUS | Status: DC
  Administered 2020-08-17: 60 ug/h via INTRAVENOUS
  Administered 2020-08-17 – 2020-08-18 (×2): 50 ug/h via INTRAVENOUS
  Administered 2020-08-19: 200 ug/h via INTRAVENOUS
  Filled 2020-08-17 (×4): qty 250

## 2020-08-17 MED ORDER — SODIUM CHLORIDE 0.9 % IV SOLN
2.0000 g | Freq: Once | INTRAVENOUS | Status: AC
  Administered 2020-08-17: 2 g via INTRAVENOUS
  Filled 2020-08-17: qty 20

## 2020-08-17 MED ORDER — THIAMINE HCL 100 MG/ML IJ SOLN
100.0000 mg | Freq: Every day | INTRAMUSCULAR | Status: DC
  Administered 2020-08-17 – 2020-08-19 (×3): 100 mg via INTRAVENOUS
  Filled 2020-08-17 (×3): qty 2

## 2020-08-17 MED ORDER — SODIUM CHLORIDE 0.9 % IV SOLN
1.0000 mg | Freq: Once | INTRAVENOUS | Status: AC
  Administered 2020-08-17: 1 mg via INTRAVENOUS
  Filled 2020-08-17: qty 0.2

## 2020-08-17 MED ORDER — PANTOPRAZOLE SODIUM 40 MG IV SOLR
40.0000 mg | Freq: Every day | INTRAVENOUS | Status: DC
  Administered 2020-08-17 – 2020-08-19 (×3): 40 mg via INTRAVENOUS
  Filled 2020-08-17 (×3): qty 40

## 2020-08-17 MED ORDER — PROPOFOL 1000 MG/100ML IV EMUL
5.0000 ug/kg/min | INTRAVENOUS | Status: DC
  Administered 2020-08-17: 20 ug/kg/min via INTRAVENOUS
  Administered 2020-08-17: 30 ug/kg/min via INTRAVENOUS
  Administered 2020-08-17: 20 ug/kg/min via INTRAVENOUS
  Administered 2020-08-17: 30 ug/kg/min via INTRAVENOUS
  Administered 2020-08-17: 50 ug/kg/min via INTRAVENOUS
  Administered 2020-08-18: 20 ug/kg/min via INTRAVENOUS
  Administered 2020-08-18 (×2): 50 ug/kg/min via INTRAVENOUS
  Administered 2020-08-18 – 2020-08-19 (×3): 40 ug/kg/min via INTRAVENOUS
  Administered 2020-08-19: 35 ug/kg/min via INTRAVENOUS
  Filled 2020-08-17 (×2): qty 100
  Filled 2020-08-17: qty 200
  Filled 2020-08-17 (×5): qty 100
  Filled 2020-08-17: qty 200

## 2020-08-17 MED ORDER — CHLORHEXIDINE GLUCONATE CLOTH 2 % EX PADS
6.0000 | MEDICATED_PAD | Freq: Every day | CUTANEOUS | Status: DC
  Administered 2020-08-18 – 2020-08-19 (×3): 6 via TOPICAL

## 2020-08-17 MED ORDER — IOHEXOL 350 MG/ML SOLN
100.0000 mL | Freq: Once | INTRAVENOUS | Status: AC | PRN
  Administered 2020-08-17: 100 mL via INTRAVENOUS

## 2020-08-17 MED ORDER — PERFLUTREN LIPID MICROSPHERE
1.0000 mL | INTRAVENOUS | Status: AC | PRN
  Administered 2020-08-17: 6 mL via INTRAVENOUS
  Filled 2020-08-17: qty 10

## 2020-08-17 MED ORDER — LACTATED RINGERS IV SOLN: INTRAVENOUS | Status: DC

## 2020-08-17 MED ORDER — DOCUSATE SODIUM 100 MG PO CAPS: 100.0000 mg | ORAL_CAPSULE | Freq: Two times a day (BID) | ORAL | Status: DC | PRN

## 2020-08-17 MED ORDER — NOREPINEPHRINE 4 MG/250ML-% IV SOLN: 0.0000 ug/min | INTRAVENOUS | Status: DC

## 2020-08-17 MED ORDER — LABETALOL HCL 5 MG/ML IV SOLN
10.0000 mg | Freq: Four times a day (QID) | INTRAVENOUS | Status: DC | PRN
  Administered 2020-08-17 – 2020-08-18 (×2): 10 mg via INTRAVENOUS
  Filled 2020-08-17 (×2): qty 4

## 2020-08-17 MED ORDER — ACETAMINOPHEN 160 MG/5ML PO SOLN
650.0000 mg | Freq: Four times a day (QID) | ORAL | Status: DC
  Administered 2020-08-17 – 2020-08-19 (×9): 650 mg
  Filled 2020-08-17 (×7): qty 20.3

## 2020-08-17 MED ORDER — LORAZEPAM 2 MG/ML IJ SOLN
INTRAMUSCULAR | Status: AC
  Administered 2020-08-17: 4 mg via INTRAVENOUS
  Filled 2020-08-17: qty 2

## 2020-08-17 MED ORDER — INSULIN ASPART 100 UNIT/ML IJ SOLN
0.0000 [IU] | INTRAMUSCULAR | Status: DC
  Administered 2020-08-17 – 2020-08-18 (×3): 2 [IU] via SUBCUTANEOUS
  Administered 2020-08-18: 3 [IU] via SUBCUTANEOUS

## 2020-08-17 MED ORDER — ORAL CARE MOUTH RINSE
15.0000 mL | OROMUCOSAL | Status: DC
  Administered 2020-08-17 – 2020-08-19 (×23): 15 mL via OROMUCOSAL

## 2020-08-17 MED ORDER — FENTANYL BOLUS VIA INFUSION
50.0000 ug | INTRAVENOUS | Status: DC | PRN
  Administered 2020-08-18: 100 ug via INTRAVENOUS
  Filled 2020-08-17: qty 100

## 2020-08-17 MED ORDER — CHLORHEXIDINE GLUCONATE 0.12% ORAL RINSE (MEDLINE KIT)
15.0000 mL | Freq: Two times a day (BID) | OROMUCOSAL | Status: DC
  Administered 2020-08-17 – 2020-08-19 (×5): 15 mL via OROMUCOSAL

## 2020-08-17 MED ORDER — SODIUM CHLORIDE 0.9 % IV SOLN
500.0000 mg | INTRAVENOUS | Status: AC
  Administered 2020-08-17 – 2020-08-19 (×3): 500 mg via INTRAVENOUS
  Filled 2020-08-17 (×3): qty 500

## 2020-08-17 MED ORDER — INSULIN ASPART 100 UNIT/ML IJ SOLN: 2.0000 [IU] | INTRAMUSCULAR | Status: DC

## 2020-08-17 NOTE — H&P (Addendum)
NAME:  Rachel Kane, MRN:  [id number], DOB:  29-Nov-1960, LOS: 0 ADMISSION DATE:  09/03/2020, CONSULTATION DATE:  6/13 REFERRING MD:  Dr. Eudelia Bunch EDP, CHIEF COMPLAINT:  Cardiac arrest   History of Present Illness:  60 year old female with PMH as below, which is significant for NICM EF 35% followed by Dr. Eldridge Dace, but has not been seen in our health system since 2017. Family reports she has been off her medications since December. Family also reports drug and alcohol history. She was complaining of SOB, which prompted EMS call. Upon their arrival she was in significant distress and her RR dropped to 6. They began to deliver bag assisted breaths. Shortly after she suffered PEA arrest. Arrest was witnessed and ACLS was done for approximately 10 minutes with ROSC to sinus tach. She was intubated in the field. Upon arrival to the ED sedation was started. Laboratory evaluation significant for CO2 15, Glucose 324, Creatinine 1.27, Ca 8.4, WBC 15, BNP 818. Some evidence of seizure vs myoclonus in ED.  Pertinent  Medical History   has a past medical history of Esophageal atresia, Essential hypertension, Family history of adverse reaction to anesthesia, History of blood transfusion, History of pneumonia, Nonischemic cardiomyopathy (HCC), and Tobacco abuse.   Significant Hospital Events: Including procedures, antibiotic start and stop dates in addition to other pertinent events   6/13 admit  Interim History / Subjective:    Objective   Blood pressure (!) 155/141, pulse (!) 107, temperature 98.8 F (37.1 C), resp. rate 19, height 5\' 4"  (1.626 m), weight 81.6 kg, SpO2 96 %.    Vent Mode: PRVC FiO2 (%):  [100 %] 100 % Set Rate:  [24 bmp] 24 bmp Vt Set:  [440 mL] 440 mL PEEP:  [5 cmH20] 5 cmH20 Plateau Pressure:  [21 cmH20] 21 cmH20   Intake/Output Summary (Last 24 hours) at 08/08/2020 08/25/2020 Last data filed at 08/29/2020 08/23/2020 Gross per 24 hour  Intake 3.79 ml  Output --  Net 3.79 ml   Filed  Weights   08/21/2020 0600  Weight: 81.6 kg    Examination: General: unresponsive middle aged female on vent HENT: [place]/AT, PERRL, no JVD Lungs: diffuse wheeze Cardiovascular: Tachy, regular, no MRG Abdomen: Soft, non-distended Extremities: No acute deformity Neuro: Coma, twitching of L upper an lower extremities.    Labs/imaging that I havepersonally reviewed  (right click and "Reselect all SmartList Selections" daily)  Labs outlined in HPI above CXR largely clear CT head pending CTA chest pending EKG with diffuse ST depression, not too different from prior.   Resolved Hospital Problem list     Assessment & Plan:   Cardiac arrest: etiology unclear. PEA ~ 10 mins. Respiratory distress on EMS call. Has not been on CHF drug. BNP 800. Drug history.  Possible anoxic injury: sz vs myoclonus on exam.  - Admit to ICU - Avoid fever - CT head - Echocardiogram - EEG - UDS - Ativan 4mg  now - Load keppra  Acute on chronic HFrEF: LVEF 35%, NICM - Echocardiogram as above - No clear indication for diuresis at this time - Holding home anti-hypertensive's - Consider cardiology consult pending neurologic recovery.   Acute hypoxemic respiratory failure: sats in the 80s despite 100% and 8 PEEP. Likely due to vent dyssynchrony. Now improved.   - CTA chest pending - Propofol/PRN fentanyl for RASS goal 0 to -1.  - Repeat ABG - CAP coverage for now - Cultures pending - Nebulized bronchodilators for wheeze, suspect cardiac wheeze but  smoking history.   Hyperglycemia without DM history - CBG monitoring and SSI  Best practice (right click and "Reselect all SmartList Selections" daily)  Diet:  NPO Pain/Anxiety/Delirium protocol (if indicated): Yes (RASS goal -1) VAP protocol (if indicated): Yes DVT prophylaxis: Subcutaneous Heparin GI prophylaxis: PPI Glucose control:  SSI Yes Central venous access:  N/A Arterial line:  N/A Foley:  N/A Mobility:  bed rest  PT consulted: N/A Last  date of multidisciplinary goals of care discussion [ ]  Code Status:  full code Disposition: ICU  Labs   CBC: Recent Labs  Lab 08-25-2020 0550 August 25, 2020 0600 25-Aug-2020 0648  WBC 14.5*  --   --   NEUTROABS 9.0*  --   --   HGB 13.9 14.3 13.9  HCT 43.5 42.0 41.0  MCV 102.4*  --   --   PLT 268  --   --     Basic Metabolic Panel: Recent Labs  Lab 08-25-20 0550 August 25, 2020 0600 08/25/20 0648  NA 136 138 139  K 4.3 4.4 4.0  CL 102 105  --   CO2 15*  --   --   GLUCOSE 324* 318*  --   BUN 9 7  --   CREATININE 1.27* 1.10*  --   CALCIUM 8.4*  --   --    GFR: Estimated Creatinine Clearance: 58.3 mL/min (A) (by C-G formula based on SCr of 1.1 mg/dL (H)). Recent Labs  Lab August 25, 2020 0550  WBC 14.5*  LATICACIDVEN 10.2*    Liver Function Tests: Recent Labs  Lab 08-25-20 0550  AST 41  ALT 18  ALKPHOS 94  BILITOT 0.7  PROT 6.3*  ALBUMIN 3.0*   No results for input(s): LIPASE, AMYLASE in the last 168 hours. No results for input(s): AMMONIA in the last 168 hours.  ABG    Component Value Date/Time   PHART 7.156 (LL) 25-Aug-2020 0648   PCO2ART 67.1 (HH) 2020-08-25 0648   PO2ART 93 Aug 25, 2020 0648   HCO3 23.6 08-25-2020 0648   TCO2 26 Aug 25, 2020 0648   ACIDBASEDEF 6.0 (H) 08-25-20 0648   O2SAT 94.0 August 25, 2020 0648     Coagulation Profile: Recent Labs  Lab August 25, 2020 0550  INR 1.1    Cardiac Enzymes: No results for input(s): CKTOTAL, CKMB, CKMBINDEX, TROPONINI in the last 168 hours.  HbA1C: Hgb A1c MFr Bld  Date/Time Value Ref Range Status  08/12/2015 07:00 PM 5.0 4.8 - 5.6 % Final    Comment:    (NOTE)         Pre-diabetes: 5.7 - 6.4         Diabetes: >6.4         Glycemic control for adults with diabetes: <7.0     CBG: No results for input(s): GLUCAP in the last 168 hours.  Review of Systems:   Patient is encephalopathic and/or intubated. Therefore history has been obtained from chart review.    Past Medical History:  She,  has a past medical history  of Esophageal atresia, Essential hypertension, Family history of adverse reaction to anesthesia, History of blood transfusion, History of pneumonia, Nonischemic cardiomyopathy (HCC), and Tobacco abuse.   Surgical History:   Past Surgical History:  Procedure Laterality Date   CARDIAC CATHETERIZATION N/A 08/14/2015   Procedure: Right/Left Heart Cath and Coronary Angiography;  Surgeon: 10/14/2015, MD;  Location: [facility] INVASIVE CV LAB;  Service: Cardiovascular;  Laterality: N/A;   COLONOSCOPY WITH PROPOFOL N/A 12/11/2015   Procedure: COLONOSCOPY WITH PROPOFOL;  Surgeon: 02/10/2016 III,  MD;  Location: [facility] ENDOSCOPY;  Service: Gastroenterology;  Laterality: N/A;   ESOPHAGUS SURGERY     a. 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   TONSILLECTOMY     TUBAL LIGATION       Social History:   reports that she quit smoking about 5 years ago. Her smoking use included cigarettes. She has a 9.00 pack-year smoking history. She has never used smokeless tobacco. She reports current alcohol use. She reports that she does not use drugs.   Family History:  Her family history includes Other in her father and mother. There is no history of Colon cancer.   Allergies Allergies  Allergen Reactions   Amoxicillin Hives   Sulfa Antibiotics Other (See Comments)    Childhood allergy-unknown     Home Medications  Prior to Admission medications   Medication Sig Start Date End Date Taking? Authorizing Provider  albuterol (PROVENTIL HFA;VENTOLIN HFA) 108 (90 Base) MCG/ACT inhaler Inhale 2 puffs into the lungs every 6 (six) hours as needed for wheezing or shortness of breath. 04/08/16  Yes Burns, Tinnie Gens, MD  carvedilol (COREG) 6.25 MG tablet Take 1 tablet (6.25 mg total) by mouth 2 (two) times daily. NEED OV. 04/27/17  Yes Corky Crafts, MD  furosemide (LASIX) 40 MG tablet Take 1 tablet (40 mg total) by mouth daily. 04/08/16  Yes Burns, Alexa R, MD   lisinopril (PRINIVIL,ZESTRIL) 20 MG tablet Take 1 tablet (20 mg total) by mouth daily. 04/08/16  Yes Burns, Tinnie Gens, MD  loratadine (CLARITIN) 10 MG tablet Take 10 mg by mouth as needed for allergies.   Yes [provider]  atorvastatin (LIPITOR) 40 MG tablet Take 1 tablet (40 mg total) by mouth daily at 6 PM. Patient not taking: No sig reported 04/08/16   Burns, Alexa R, MD  fluticasone (FLONASE) 50 MCG/ACT nasal spray Place 2 sprays into both nostrils daily. Patient not taking: No sig reported 04/08/16   Servando Snare, MD  guaiFENesin (MUCINEX) 600 MG 12 hr tablet Take 1 tablet (600 mg total) by mouth 2 (two) times daily. Patient not taking: No sig reported 04/08/16   Burns, Tinnie Gens, MD  guaiFENesin-codeine 100-10 MG/5ML syrup Take 5 mLs by mouth every 6 (six) hours as needed for cough. Patient not taking: No sig reported 04/08/16   Burns, Tinnie Gens, MD  guaiFENesin-dextromethorphan (ROBITUSSIN DM) 100-10 MG/5ML syrup Take 5 mLs by mouth every 4 (four) hours as needed for cough. Patient not taking: No sig reported 04/08/16   Servando Snare, MD     Critical care time: 48 min      Joneen Roach, AGACNP-BC [facility]  See Amion for personal pager PCCM on call pager [phone] until 7pm. Please call Elink 7p-7a. [phone]  09/09/20 7:46 AM

## 2020-08-17 NOTE — Progress Notes (Signed)
LTM started; no initial skin breakdown; [facility] monitoring,nurse educated on event button.

## 2020-08-17 NOTE — Consult Note (Signed)
Neurology Consultation Reason for Consult: seizure Referring Physician: Dr Levon Hedger  CC: cardiorespiratory arrest  History is obtained from: family at bedside, chart review as patient is altered and intubated   HPI: Rachel Kane is a 60 y.o. female with past medical history of nonischemic cardiomyopathy with 35% ejection fraction, alcohol and drug use who was brought in after cardiorespiratory arrest.  Per son, patient this morning suddenly had trouble breathing and told her boyfriend to call 911.  When EMS reached, patient was noted to be in respiratory distress followed by PEA arrest.  ACLS was performed, 10 minutes to ROSC.  Upon arrival in the ED, patient was noted to have spontaneous eye opening, twitching of face and bilateral lower extremities.  Therefore neurology was consulted for further management.   ROS: Unable to obtain due to altered mental status.   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   Essential hypertension    a. Dx @ least 20 years ago.   Family history of adverse reaction to anesthesia    mother has nausea    History of blood transfusion    History of pneumonia    a. 2000: required chest tube.   Nonischemic cardiomyopathy (HCC)    a. 08/2015: EF 25-30% by cath, cors widely patent.   Tobacco abuse     Family History  Problem Relation Age of Onset   Other Father        died of suicide in setting of chronic pain.   Other Mother        alive and well   Colon cancer Neg Hx     Social History: Per chart review she quit smoking about 5 years ago. Her smoking use included cigarettes. She has a 9.00 pack-year smoking history. She has never used smokeless tobacco. She reports current alcohol use. She reports that she does not use drugs.   Medications Prior to Admission  Medication Sig Dispense Refill Last Dose   albuterol (PROVENTIL HFA;VENTOLIN HFA) 108 (90 Base)  MCG/ACT inhaler Inhale 2 puffs into the lungs every 6 (six) hours as needed for wheezing or shortness of breath. 1 Inhaler 2 unk   carvedilol (COREG) 6.25 MG tablet Take 1 tablet (6.25 mg total) by mouth 2 (two) times daily. NEED OV. 180 tablet 0 unk   furosemide (LASIX) 40 MG tablet Take 1 tablet (40 mg total) by mouth daily. 90 tablet 3 unk   lisinopril (PRINIVIL,ZESTRIL) 20 MG tablet Take 1 tablet (20 mg total) by mouth daily. 90 tablet 3 unk   loratadine (CLARITIN) 10 MG tablet Take 10 mg by mouth as needed for allergies.   unk   atorvastatin (LIPITOR) 40 MG tablet Take 1 tablet (40 mg total) by mouth daily at 6 PM. (Patient not taking: No sig reported) 90 tablet 3 Not Taking   fluticasone (FLONASE) 50 MCG/ACT nasal spray Place 2 sprays into both nostrils daily. (Patient not taking: No sig reported) 16 g 2 Not Taking   guaiFENesin (MUCINEX) 600 MG 12 hr tablet Take 1 tablet (600 mg total) by mouth 2 (two) times daily. (Patient not taking: No sig reported) 14 tablet 0 Not Taking   guaiFENesin-codeine 100-10 MG/5ML syrup Take 5 mLs by mouth every 6 (six) hours as needed for cough. (Patient not taking: No sig reported) 120 mL 0 Not Taking   guaiFENesin-dextromethorphan (ROBITUSSIN DM) 100-10 MG/5ML syrup Take 5  mLs by mouth every 4 (four) hours as needed for cough. (Patient not taking: No sig reported) 118 mL 0 Not Taking      Exam: Current vital signs: BP (!) 169/86   Pulse 91   Temp (!) 102.2 F (39 C)   Resp (!) 30   Ht 5\' 4"  (1.626 m)   Wt 81.6 kg Comment: from 2018 records, please update  SpO2 98%   BMI 30.90 kg/m  Vital signs in last 24 hours: Temp:  [97.7 F (36.5 C)-102.2 F (39 C)] 102.2 F (39 C) (06/13 1600) Pulse Rate:  [88-116] 91 (06/13 1535) Resp:  [19-30] 30 (06/13 1600) BP: (111-197)/(63-141) 169/86 (06/13 1600) SpO2:  [95 %-100 %] 98 % (06/13 1600) FiO2 (%):  [50 %-100 %] 50 % (06/13 1535) Weight:  [81.6 kg] 81.6 kg (06/13 0600)   Physical Exam   Constitutional: Appears well-developed and well-nourished.  Psych: Unable to assess due to intubation/sedation Eyes: No scleral injection HENT: No OP obstrucion Head: Normocephalic.  Cardiovascular: Normal rate and regular rhythm.  Respiratory: Intubated, coarse breath sounds bilaterally  GI: Soft.  No distension. There is no tenderness.  Skin: Warm Neuro: On propofol, comatose, does not open eyes noxious stimuli, pupils slowly reacting to light, corneal reflex absent, gag is absent, does not withdraw to noxious stimuli in any extremity   I have reviewed labs in epic and the results pertinent to this consultation are: CBC:  Recent Labs  Lab 08/11/2020 0550 08/21/2020 0600 08/14/2020 0923 08/20/2020 1203  WBC 14.5*  --  11.9*  --   NEUTROABS 9.0*  --   --   --   HGB 13.9   < > 13.8 12.6  HCT 43.5   < > 42.4 37.0  MCV 102.4*  --  99.5  --   PLT 268  --  184  --    < > = values in this interval not displayed.    Basic Metabolic Panel:  Lab Results  Component Value Date   NA 140 08/07/2020   K 3.8 08/28/2020   CO2 15 (L) 08/16/2020   GLUCOSE 318 (H) 08/27/2020   BUN 7 08/24/2020   CREATININE 1.24 (H) 08/15/2020   CALCIUM 8.4 (L) 08/18/2020   GFRNONAA 51 (L) 08/12/2020   GFRAA 95 08/26/2015   Lipid Panel:  Lab Results  Component Value Date   LDLCALC 160 (H) 08/12/2015   HgbA1c:  Lab Results  Component Value Date   HGBA1C 5.0 08/12/2015   Urine Drug Screen:     Component Value Date/Time   LABOPIA POSITIVE (A) 08/25/2020 0649   COCAINSCRNUR NONE DETECTED 08/16/2020 0649   LABBENZ POSITIVE (A) 08/23/2020 0649   AMPHETMU NONE DETECTED 08/28/2020 0649   THCU NONE DETECTED 08/18/2020 0649   LABBARB NONE DETECTED 08/28/2020 0649    Alcohol Level     Component Value Date/Time   ETH <10 08/15/2020 0702     I have reviewed the images obtained:  CT Head without contrast 08/31/2020: Focal abnormalities of the deep white matter, right more than left. Differential  diagnosis is small-vessel ischemic change versus demyelinating disease. No definitely acute insult by CT.  ASSESSMENT/PLAN: 60 year old female status post cardiorespiratory arrest, 10 minutes to ROSC.  He was noted to be in status myoclonus on arrival which has since improved after starting Keppra and propofol.  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 -Discussed with patient's son and family member at bedside that myoclonic status epilepticus within 24 hours of cardiorespiratory arrest is usually suggestive of severe neurological injury and portends poor prognosis.  Per family, patient would not want to be on tracheostomy and feeding tube long-term and would prefer to be mobile, able to speak.  Also discussed CODE STATUS with family.  Patient's son expressed understanding and would like to discuss with family before making any changes in goals of care and CODE STATUS -Continue seizure precautions -Management of rest of comorbidities per primary team  Thank you for allowing [place] to participate in the care of this patient. If you have any further questions, please contact  me or neurohospitalist.   CRITICAL CARE Performed by: Charlsie Quest   Total critical care time: 35 minutes  Critical care time was exclusive of separately billable procedures and treating other patients.  Critical care was necessary to treat or prevent imminent or life-threatening deterioration.  Critical care was time spent personally by me on the following activities: development of treatment plan with patient and/or surrogate as well as nursing, discussions with consultants,  evaluation of patient's response to treatment, examination of patient, obtaining history from patient or surrogate, ordering and performing treatments and interventions, ordering and review of laboratory studies, ordering and review of radiographic studies, pulse oximetry and re-evaluation of patient's condition.   Lindie Spruce Epilepsy [facility] neurohospitalist

## 2020-08-17 NOTE — Procedures (Signed)
Central Venous Catheter Insertion Procedure Note  Rachel Kane  [id number]  [birth year]  Date:2020/08/29  Time:2:10 PM   Provider Performing:Santiaga Butzin Erby Pian   Procedure: Insertion of Non-tunneled Central Venous [phone]) with US guidance ([phone])   Indication(s) Medication administration  Consent Risks of the procedure as well as the alternatives and risks of each were explained to the patient and/or caregiver.  Consent for the procedure was obtained and is signed in the bedside chart  Anesthesia Topical only with 1% lidocaine   Timeout Verified patient identification, verified procedure, site/side was marked, verified correct patient position, special equipment/implants available, medications/allergies/relevant history reviewed, required imaging and test results available.  Sterile Technique Maximal sterile technique including full sterile barrier drape, hand hygiene, sterile gown, sterile gloves, mask, hair covering, sterile ultrasound probe cover (if used).  Procedure Description Area of catheter insertion was cleaned with chlorhexidine and draped in sterile fashion.  With real-time ultrasound guidance a central venous catheter was placed into the left internal jugular vein. Nonpulsatile blood flow and easy flushing noted in all ports.  The catheter was sutured in place and sterile dressing applied.  Complications/Tolerance None; patient tolerated the procedure well. Chest X-ray is ordered to verify placement for internal jugular or subclavian cannulation.   Chest x-ray is not ordered for femoral cannulation.  EBL Minimal  Specimen(s) None

## 2020-08-17 NOTE — Progress Notes (Signed)
Pt transported to on vent. to CT and to 2H17, without any complications.vitals stable, RN at bedside, and RT will continue to monitor.

## 2020-08-17 NOTE — ED Notes (Signed)
Patients son in consult A

## 2020-08-17 NOTE — Plan of Care (Signed)
Updated patient's mother and her son at bedside. Suggested that status myoclonus could be sign of severe anoxic brain injury, patient's mother stated she did not want tracheostomy or PEG tube placement or living in nursing home for long-term.  She suggested to keep patient DNR and continue full scope of care for next few days until further diagnostic test are done.  DNR orders were placed    Cheri Fowler MD [facility] See Amion for pager If no response to pager, please call [phone] until 7pm After 7pm, Please call E-link [phone]

## 2020-08-17 NOTE — ED Provider Notes (Signed)
[facility] EMERGENCY DEPARTMENT Provider Note  CSN: [id number] Arrival date & time: 08/12/2020 0546  Chief Complaint(s) Post CPR ED Triage Notes Howell Rucks, RN (Registered Nurse)   Emergency Medicine   Date of Service: 08/10/2020  6:15 AM   Signed   EMS brings pt in as a post CPR after initially calling 911 for resp distress. Medic on scene states pt was assisted with ventilations and then became unresponsive. Pt asystole and CPR initiated for 10 min with 2 rounds of epi. Pt intubated PTA. Pt with cardiac hx and HTN.      HPI Rachel Kane is a 60 y.o. female here as a cardiac arrest.  Witnessed by EMS.  They were called out for respiratory distress.   Remainder of history, ROS, and physical exam limited due to patient's condition (unresponsive). Additional information was obtained from EMS.   Level V Caveat.   The history is provided by the EMS personnel.   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   Essential hypertension    a. Dx @ least 20 years ago.   Family history of adverse reaction to anesthesia    mother has nausea    History of blood transfusion    History of pneumonia    a. 2000: required chest tube.   Nonischemic cardiomyopathy (HCC)    a. 08/2015: EF 25-30% by cath, cors widely patent.   Tobacco abuse    Patient Active Problem List   Diagnosis Date Noted   Cardiac arrest (HCC) 08/25/2020   Acute URI 04/08/2016   Healthcare maintenance 08/26/2015   Nonischemic cardiomyopathy (HCC) 08/14/2015   Chronic combined systolic and diastolic congestive heart failure (HCC) 08/13/2015   Essential hypertension    Tobacco abuse    Home Medication(s) Prior to Admission medications   Medication Sig Start Date End Date Taking? Authorizing Provider  albuterol (PROVENTIL HFA;VENTOLIN HFA) 108 (90 Base) MCG/ACT inhaler Inhale 2 puffs into  the lungs every 6 (six) hours as needed for wheezing or shortness of breath. 04/08/16  Yes Burns, Tinnie Gens, MD  carvedilol (COREG) 6.25 MG tablet Take 1 tablet (6.25 mg total) by mouth 2 (two) times daily. NEED OV. 04/27/17  Yes Corky Crafts, MD  furosemide (LASIX) 40 MG tablet Take 1 tablet (40 mg total) by mouth daily. 04/08/16  Yes Burns, Alexa R, MD  lisinopril (PRINIVIL,ZESTRIL) 20 MG tablet Take 1 tablet (20 mg total) by mouth daily. 04/08/16  Yes Burns, Tinnie Gens, MD  loratadine (CLARITIN) 10 MG tablet Take 10 mg by mouth as needed for allergies.   Yes [provider]  atorvastatin (LIPITOR) 40 MG tablet Take 1 tablet (40 mg total) by mouth daily at 6 PM. Patient not taking: No sig reported 04/08/16   Burns, Alexa R, MD  fluticasone (FLONASE) 50 MCG/ACT nasal spray Place 2 sprays into both nostrils daily. Patient not taking: No sig reported 04/08/16   Servando Snare, MD  guaiFENesin (MUCINEX) 600 MG 12 hr tablet Take 1 tablet (600 mg total) by mouth 2 (two) times daily. Patient not taking: No sig reported 04/08/16   Burns, Tinnie Gens, MD  guaiFENesin-codeine 100-10 MG/5ML syrup Take 5 mLs by mouth every 6 (six) hours as needed for cough. Patient not taking: No sig reported 04/08/16   Burns, Tinnie Gens, MD  guaiFENesin-dextromethorphan (ROBITUSSIN DM) 100-10 MG/5ML syrup Take 5  mLs by mouth every 4 (four) hours as needed for cough. Patient not taking: No sig reported 04/08/16   Servando Snare, MD                                                                                                                                    Past Surgical History Past Surgical History:  Procedure Laterality Date   CARDIAC CATHETERIZATION N/A 08/14/2015   Procedure: Right/Left Heart Cath and Coronary Angiography;  Surgeon: Lyn Records, MD;  Location: [facility] INVASIVE CV LAB;  Service: Cardiovascular;  Laterality: N/A;   COLONOSCOPY WITH PROPOFOL N/A 12/11/2015   Procedure: COLONOSCOPY WITH PROPOFOL;  Surgeon: Sherrilyn Rist, MD;  Location: [facility] ENDOSCOPY;  Service: Gastroenterology;  Laterality: N/A;   ESOPHAGUS SURGERY     a. 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   TONSILLECTOMY     TUBAL LIGATION     Family History Family History  Problem Relation Age of Onset   Other Father        died of suicide in setting of chronic pain.   Other Mother        alive and well   Colon cancer Neg Hx     Social History Social History   Tobacco Use   Smoking status: Former    Packs/day: 0.30    Years: 30.00    Pack years: 9.00    Types: Cigarettes    Quit date: 08/06/2015    Years since quitting: 5.0   Smokeless tobacco: Never   Tobacco comments:    down to 2 cigs/week  Substance Use Topics   Alcohol use: Yes    Alcohol/week: 0.0 standard drinks    Comment: 1 beer/day during the week.  Sometimes more on the weekends.   Drug use: No   Allergies Amoxicillin and Sulfa antibiotics  Review of Systems Review of Systems Unable to obtain due to unresponsiveness Physical Exam Vital Signs  I have reviewed the triage vital signs BP (!) 155/141   Pulse (!) 107   Temp 98.8 F (37.1 C)   Resp 19   Ht 5\' 4"  (1.626 m)   Wt 81.6 kg Comment: from 2018 records, please update  SpO2 96%   BMI 30.90 kg/m   Physical Exam Vitals reviewed.  Constitutional:      General: She is in acute distress.     Appearance: She is well-developed. She is not diaphoretic.     Interventions: She is intubated.  HENT:     Head: Normocephalic and atraumatic.     Nose: Nose normal.  Eyes:     General: No scleral icterus.       Right eye: No discharge.        Left eye: No discharge.     Conjunctiva/sclera: Conjunctivae normal.     Pupils: Pupils are equal, round, and reactive  to light.  Cardiovascular:     Rate and Rhythm: Regular rhythm. Tachycardia present.     Pulses:          Radial pulses are 2+ on the right side and 2+ on the left side.     Heart  sounds: No murmur heard.   No friction rub. No gallop.  Pulmonary:     Effort: Respiratory distress present. She is intubated.     Breath sounds: No stridor. Examination of the right-upper field reveals rales. Examination of the left-upper field reveals rales. Examination of the right-middle field reveals rales. Examination of the left-middle field reveals rales. Examination of the right-lower field reveals rales. Examination of the left-lower field reveals rales. Rales (fine) present.     Comments: 7.0 ETT in place. 26 at lips Abdominal:     General: There is no distension.     Palpations: Abdomen is soft.     Tenderness: There is no abdominal tenderness.  Musculoskeletal:        General: No tenderness.     Cervical back: Normal range of motion and neck supple.  Skin:    General: Skin is warm and dry.     Findings: No erythema or rash.  Neurological:     Mental Status: She is unresponsive.    ED Results and Treatments Labs (all labs ordered are listed, but only abnormal results are displayed) Labs Reviewed  COMPREHENSIVE METABOLIC PANEL - Abnormal; Notable for the following components:      Result Value   CO2 15 (*)    Glucose, Bld 324 (*)    Creatinine, Ser 1.27 (*)    Calcium 8.4 (*)    Total Protein 6.3 (*)    Albumin 3.0 (*)    GFR, Estimated 49 (*)    Anion gap 19 (*)    All other components within normal limits  CBC WITH DIFFERENTIAL/PLATELET - Abnormal; Notable for the following components:   WBC 14.5 (*)    MCV 102.4 (*)    Neutro Abs 9.0 (*)    Lymphs Abs 4.7 (*)    Abs Immature Granulocytes 0.32 (*)    All other components within normal limits  URINALYSIS, ROUTINE W REFLEX MICROSCOPIC - Abnormal; Notable for the following components:   APPearance HAZY (*)    Glucose, UA >=500 (*)    Hgb urine dipstick SMALL (*)    Protein, ur >=300 (*)    Bacteria, UA FEW (*)    All other components within normal limits  BRAIN NATRIURETIC PEPTIDE - Abnormal; Notable for the  following components:   B Natriuretic Peptide 818.7 (*)    All other components within normal limits  I-STAT CHEM 8, ED - Abnormal; Notable for the following components:   Creatinine, Ser 1.10 (*)    Glucose, Bld 318 (*)    Calcium, Ion 1.09 (*)    TCO2 18 (*)    All other components within normal limits  I-STAT ARTERIAL BLOOD GAS, ED - Abnormal; Notable for the following components:   pH, Arterial 7.156 (*)    pCO2 arterial 67.1 (*)    Acid-base deficit 6.0 (*)    All other components within normal limits  TROPONIN I (HIGH SENSITIVITY) - Abnormal; Notable for the following components:   Troponin I (High Sensitivity) 45 (*)    All other components within normal limits  RESP PANEL BY RT-PCR (FLU A&B, COVID) ARPGX2  CULTURE, BLOOD (ROUTINE X 2)  CULTURE, BLOOD (ROUTINE X 2)  CULTURE,  RESPIRATORY W GRAM STAIN  RESPIRATORY PANEL BY PCR  PROTIME-INR  LACTIC ACID, PLASMA  LACTIC ACID, PLASMA  BLOOD GAS, ARTERIAL  ETHANOL  RAPID URINE DRUG SCREEN, HOSP PERFORMED  HIV ANTIBODY (ROUTINE TESTING W REFLEX)  CBC  CREATININE, SERUM  RAPID URINE DRUG SCREEN, HOSP PERFORMED  LACTIC ACID, PLASMA  LACTIC ACID, PLASMA  BETA-HYDROXYBUTYRIC ACID  MAGNESIUM  PHOSPHORUS                                                                                                                         EKG  EKG Interpretation  Date/Time:  Monday August 17 2020 05:54:51 EDT Ventricular Rate:  111 PR Interval:  170 QRS Duration: 112 QT Interval:  320 QTC Calculation: 435 R Axis:   80 Text Interpretation: Sinus tachycardia Borderline intraventricular conduction delay Repol abnrm suggests ischemia, diffuse leads No acute changes from prior tracings Confirmed by Drema Pry [phone]) on 08/20/2020 6:11:03 AM        Radiology DG Chest Portable 1 View  Result Date: 08/18/2020 CLINICAL DATA:  Intubation.  CPR EXAM: PORTABLE CHEST 1 VIEW COMPARISON:  08/12/2015 FINDINGS: Borderline cardiomegaly which is  similar to prior. Mild interstitial coarsening with postoperative changes on the right. Aeration is improved when compared to prior. Unremarkable aortic and hilar contours. There is artifact from EKG leads and defibrillator pads. The endotracheal tube tip is between the clavicular heads and carina. The enteric tube reaches the stomach. IMPRESSION: Unremarkable hardware positioning. No evidence of acute cardiopulmonary disease. Electronically Signed   By: Marnee Spring M.D.   On: 08/14/2020 06:01    Pertinent labs & imaging results that were available during my care of the patient were reviewed by me and considered in my medical decision making (see chart for details).  Medications Ordered in ED Medications  propofol (DIPRIVAN) 1000 MG/100ML infusion (20 mcg/kg/min  81.6 kg Intravenous New Bag/Given 08/23/2020 0622)  docusate sodium (COLACE) capsule 100 mg (has no administration in time range)  polyethylene glycol (MIRALAX / GLYCOLAX) packet 17 g (has no administration in time range)  heparin injection 5,000 Units (has no administration in time range)  insulin aspart (novoLOG) injection 2-6 Units (has no administration in time range)  midazolam (VERSED) 5 MG/5ML injection (2.5 mg Intravenous Given 08/29/2020 0553)  Procedures .1-3 Lead EKG Interpretation  Date/Time: 2020-05-25 6:18 AM Performed by: Nira Connardama, Avangelina Flight Eduardo, MD Authorized by: Nira Connardama, Luellen Howson Eduardo, MD     Interpretation: abnormal     ECG rate:  114   ECG rate assessment: tachycardic     Rhythm: sinus tachycardia     Ectopy: none     Conduction: normal   .Critical Care  Date/Time: 2020-05-25 6:19 AM Performed by: Nira Connardama, Shelsey Rieth Eduardo, MD Authorized by: Nira Connardama, Kesa Birky Eduardo, MD   Critical care provider statement:    Critical care time (minutes):  80   Critical care was necessary to treat or  prevent imminent or life-threatening deterioration of the following conditions:  Cardiac failure, circulatory failure and respiratory failure   Critical care was time spent personally by me on the following activities:  Discussions with consultants, evaluation of patient's response to treatment, examination of patient, ordering and performing treatments and interventions, ordering and review of laboratory studies, ordering and review of radiographic studies, pulse oximetry, re-evaluation of patient's condition, obtaining history from patient or surrogate and review of old charts   Care discussed with: admitting provider    (including critical care time)  Medical Decision Making / ED Course I have reviewed the nursing notes for this encounter and the patient's prior records (if available in EHR or on provided paperwork).   Rachel Kane was evaluated in Emergency Department on 2020-05-25 for the symptoms described in the history of present illness. She was evaluated in the context of the global COVID-19 pandemic, which necessitated consideration that the patient might be at risk for infection with the SARS-CoV-2 virus that causes COVID-19. Institutional protocols and algorithms that pertain to the evaluation of patients at risk for COVID-19 are in a state of rapid change based on information released by regulatory bodies including the CDC and federal and state organizations. These policies and algorithms were followed during the patient's care in the ED.  Respiratory distress leading to cardiac arrest. ROSC obtained en route after ACLS. On arrival, ABCs intact.  Patient has a history of nonischemic cardiomyopathy with systolic/diastolic dysfunction.  Son arrived and reported that he spoke with the boyfriend whom the patient lives with.  Apparently she awoke in the middle of the night and called out to the boyfriend due to shortness of breath, prompting the call to EMS.  He reports that the patient  has not filled her prescription medication since December 2021. He reports that she has a history of drug and alcohol use. She is still smoking.  Labs trickling in and consistent with likely CHF exacerbation.  Patient admitted to ICU for further work-up and management.       Final Clinical Impression(s) / ED Diagnoses Final diagnoses:  Cardiac arrest [facility](HCC)      This chart was dictated using voice recognition software.  Despite best efforts to proofread,  errors can occur which can change the documentation meaning.    Nira Connardama, Ankur Snowdon Eduardo, MD 21-May-2020 [phone]

## 2020-08-17 NOTE — Procedures (Signed)
Patient Name: Rachel Kane  MRN: [id number]  Epilepsy Attending: Charlsie Quest  Referring Physician/Provider: Dr Levon Hedger Duration: 08/25/2020 1048 to 08/18/2020 1048  Patient history: 60 year old female status post cardiac arrest.  EEG to evaluate for seizures.  Level of alertness: Comatose  AEDs during EEG study: Propofol, LEV  Technical aspects: This EEG study was done with scalp electrodes positioned according to the 10-20 International system of electrode placement. Electrical activity was acquired at a sampling rate of 500Hz  and reviewed with a high frequency filter of 70Hz  and a low frequency filter of 1Hz . EEG data were recorded continuously and digitally stored.   Description: EEG initially showed burst suppression pattern with highly epileptiform bursts lasting 2 to 4 seconds, occurring every 8 to 12 seconds.  Gradually, as propofol was increased the bursts improved and transitioned into intermittent sharply contoured 5 to 9 Hz theta-alpha activity lasting 2-4 seconds alternating with generalized suppression lasting EEG was not reactive to tactile stimulation.  Hyperventilation and photic stimulation were not performed.     ABNORMALITY - Burst suppression with highly epileptiform discharges, generalized - Intermittent slow, generalized - EEG suppression, generalized  IMPRESSION: This study initially showed evidence of epileptogenicity with generalized onset and high potential for seizures. Gradually as propofol was increased, EEG was suggestive of severe diffuse encephalopathy, nonspecific etiology but could be related to sedation, anoxic/hypoxic brain injury.  No definite seizures were seen throughout the recording.  Kyan Yurkovich 

## 2020-08-17 NOTE — ED Triage Notes (Signed)
EMS brings pt in as a post CPR after initially calling 911 for resp distress. Medic on scene states pt was assisted with ventilations and then became unresponsive. Pt asystole and CPR initiated for 10 min with 2 rounds of epi. Pt intubated PTA. Pt with cardiac hx and HTN.

## 2020-08-18 DIAGNOSIS — J9601 Acute respiratory failure with hypoxia: Secondary | ICD-10-CM

## 2020-08-18 LAB — HEMOGLOBIN A1C
Hgb A1c MFr Bld: 5.4 % (ref 4.8–5.6)
Mean Plasma Glucose: 108 mg/dL

## 2020-08-18 LAB — CBC
HCT: 37.5 % (ref 36.0–46.0)
Hemoglobin: 12.3 g/dL (ref 12.0–15.0)
MCH: 32.2 pg (ref 26.0–34.0)
MCHC: 32.8 g/dL (ref 30.0–36.0)
MCV: 98.2 fL (ref 80.0–100.0)
Platelets: 193 10*3/uL (ref 150–400)
RBC: 3.82 MIL/uL — ABNORMAL LOW (ref 3.87–5.11)
RDW: 12.4 % (ref 11.5–15.5)
WBC: 14.9 10*3/uL — ABNORMAL HIGH (ref 4.0–10.5)
nRBC: 0 % (ref 0.0–0.2)

## 2020-08-18 LAB — BASIC METABOLIC PANEL
Anion gap: 11 (ref 5–15)
Anion gap: 10 (ref 5–15)
BUN: 19 mg/dL (ref 6–20)
BUN: 19 mg/dL (ref 6–20)
CO2: 21 mmol/L — ABNORMAL LOW (ref 22–32)
CO2: 21 mmol/L — ABNORMAL LOW (ref 22–32)
Calcium: 8.2 mg/dL — ABNORMAL LOW (ref 8.9–10.3)
Calcium: 8.5 mg/dL — ABNORMAL LOW (ref 8.9–10.3)
Chloride: 106 mmol/L (ref 98–111)
Chloride: 106 mmol/L (ref 98–111)
Creatinine, Ser: 0.93 mg/dL (ref 0.44–1.00)
Creatinine, Ser: 0.87 mg/dL (ref 0.44–1.00)
GFR, Estimated: >60 mL/min (ref >60)
GFR, Estimated: >60 mL/min (ref >60)
Glucose, Bld: 129 mg/dL — ABNORMAL HIGH (ref 70–99)
Glucose, Bld: 136 mg/dL — ABNORMAL HIGH (ref 70–99)
Potassium: 3.3 mmol/L — ABNORMAL LOW (ref 3.5–5.1)
Potassium: 3.7 mmol/L (ref 3.5–5.1)
Sodium: 138 mmol/L (ref 135–145)
Sodium: 137 mmol/L (ref 135–145)

## 2020-08-18 LAB — GLUCOSE, CAPILLARY
Glucose-Capillary: 120 mg/dL — ABNORMAL HIGH (ref 70–99)
Glucose-Capillary: 120 mg/dL — ABNORMAL HIGH (ref 70–99)
Glucose-Capillary: 125 mg/dL — ABNORMAL HIGH (ref 70–99)
Glucose-Capillary: 132 mg/dL — ABNORMAL HIGH (ref 70–99)
Glucose-Capillary: 137 mg/dL — ABNORMAL HIGH (ref 70–99)
Glucose-Capillary: 142 mg/dL — ABNORMAL HIGH (ref 70–99)
Glucose-Capillary: 169 mg/dL — ABNORMAL HIGH (ref 70–99)

## 2020-08-18 LAB — MAGNESIUM
Magnesium: 1.8 mg/dL (ref 1.7–2.4)
Magnesium: 2.5 mg/dL — ABNORMAL HIGH (ref 1.7–2.4)

## 2020-08-18 LAB — PHOSPHORUS: Phosphorus: 4.4 mg/dL (ref 2.5–4.6)

## 2020-08-18 LAB — STREP PNEUMONIAE URINARY ANTIGEN: Strep Pneumo Urinary Antigen: NEGATIVE

## 2020-08-18 MED ORDER — DOCUSATE SODIUM 50 MG/5ML PO LIQD: 100.0000 mg | Freq: Two times a day (BID) | ORAL | Status: DC | PRN

## 2020-08-18 MED ORDER — HYDRALAZINE HCL 20 MG/ML IJ SOLN
10.0000 mg | INTRAMUSCULAR | Status: DC | PRN
  Administered 2020-08-18: 20 mg via INTRAVENOUS
  Filled 2020-08-18: qty 1

## 2020-08-18 MED ORDER — MAGNESIUM SULFATE 2 GM/50ML IV SOLN
2.0000 g | Freq: Once | INTRAVENOUS | Status: AC
  Administered 2020-08-18: 2 g via INTRAVENOUS
  Filled 2020-08-18: qty 50

## 2020-08-18 MED ORDER — POLYETHYLENE GLYCOL 3350 17 G PO PACK: 17.0000 g | PACK | Freq: Every day | ORAL | Status: DC | PRN

## 2020-08-18 MED ORDER — SODIUM CHLORIDE 0.9 % IV SOLN
3.0000 g | Freq: Four times a day (QID) | INTRAVENOUS | Status: DC
  Administered 2020-08-18 – 2020-08-19 (×5): 3 g via INTRAVENOUS
  Filled 2020-08-18 (×2): qty 3
  Filled 2020-08-18 (×5): qty 8

## 2020-08-18 MED ORDER — POTASSIUM CHLORIDE 20 MEQ PO PACK
20.0000 meq | PACK | Freq: Two times a day (BID) | ORAL | Status: DC
  Administered 2020-08-18: 20 meq via ORAL
  Filled 2020-08-18: qty 1

## 2020-08-18 MED ORDER — FUROSEMIDE 40 MG PO TABS
40.0000 mg | ORAL_TABLET | Freq: Every day | ORAL | Status: DC
  Administered 2020-08-19: 40 mg
  Filled 2020-08-18: qty 1

## 2020-08-18 MED ORDER — POTASSIUM CHLORIDE 20 MEQ PO PACK
20.0000 meq | PACK | Freq: Two times a day (BID) | ORAL | Status: DC
  Administered 2020-08-18 – 2020-08-19 (×2): 20 meq
  Filled 2020-08-18 (×2): qty 1

## 2020-08-18 MED ORDER — LABETALOL HCL 5 MG/ML IV SOLN: 10.0000 mg | INTRAVENOUS | Status: DC | PRN

## 2020-08-18 MED ORDER — CARVEDILOL 6.25 MG PO TABS
6.2500 mg | ORAL_TABLET | Freq: Two times a day (BID) | ORAL | Status: DC
  Administered 2020-08-18 – 2020-08-19 (×2): 6.25 mg
  Filled 2020-08-18 (×2): qty 1

## 2020-08-18 MED ORDER — LISINOPRIL 20 MG PO TABS: 20.0000 mg | ORAL_TABLET | Freq: Every day | ORAL | Status: DC

## 2020-08-18 NOTE — Progress Notes (Signed)
Subjective: No acute events overnight.  Son and family member at bedside.  No clinical seizures  ROS: unable to obtain due to poor mental status  Examination  Vital signs in last 24 hours: Temp:  [99.32 F (37.4 C)-103.1 F (39.5 C)] 99.68 F (37.6 C) (06/14 1120) Pulse Rate:  [71-91] 78 (06/14 1120) Resp:  [22-34] 30 (06/14 1120) BP: (122-181)/(68-100) 154/82 (06/14 1100) SpO2:  [92 %-100 %] 98 % (06/14 1120) FiO2 (%):  [40 %-50 %] 40 % (06/14 1120) Weight:  [77.8 kg] 77.8 kg (06/14 0500)  General: lying in bed, not in apparent distress CVS: pulse-normal rate and rhythm RS: Intubated, coarse breath sounds bilaterally, does not initiate breaths abdomen Extremities: normal, warm Neuro: On propofol at 50 MCG per hour, comatose, does not open eyes noxious stimuli, PERRLA, corneal reflex absent, gag reflex absent, does not withdraw to noxious stimuli in bilateral upper extremities, left toe extension after noxious stimulation on left lower extremity, no movement in right lower extremity  Basic Metabolic Panel: Recent Labs  Lab 08/22/2020 0550 08/21/2020 0600 08/29/2020 0648 08/10/2020 0913 08/26/2020 0923 08/27/2020 1203 08/18/20 0536  NA 136 138 139 140  --  140 138  K 4.3 4.4 4.0 3.8  --  3.8 3.3*  CL 102 105  --   --   --   --  106  CO2 15*  --   --   --   --   --  21*  GLUCOSE 324* 318*  --   --   --   --  129*  BUN 9 7  --   --   --   --  19  CREATININE 1.27* 1.10*  --   --  1.24*  --  0.93  CALCIUM 8.4*  --   --   --   --   --  8.2*  MG  --   --   --   --  2.2  --  1.8  PHOS  --   --   --   --  5.2*  --  4.4    CBC: Recent Labs  Lab 08/24/2020 0550 08/11/2020 0600 08/31/2020 0648 08/27/2020 0913 08/16/2020 0923 08/16/2020 1203 08/18/20 0536  WBC 14.5*  --   --   --  11.9*  --  14.9*  NEUTROABS 9.0*  --   --   --   --   --   --   HGB 13.9   < > 13.9 12.9 13.8 12.6 12.3  HCT 43.5   < > 41.0 38.0 42.4 37.0 37.5  MCV 102.4*  --   --   --  99.5  --  98.2  PLT 268  --   --   --  184   --  193   < > = values in this interval not displayed.     Coagulation Studies: Recent Labs    08/10/2020 0550  LABPROT 14.1  INR 1.1    Imaging No new brain imaging overnight  ASSESSMENT AND PLAN: 60 year old female status post cardiorespiratory arrest, 10 minutes to ROSC.  He was noted to be in status myoclonus on arrival which has since improved after starting Keppra and propofol.   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  Recommendations: -Plan to wean propofol at 10 MCG per hour to stop.  If clinical seizures recur, will resume propofol - Continue Keppra 1000 mg  twice daily, can increase upto 2000 mg twice daily. -MRI brain without contrast to look for anoxic/hypoxic brain injury likely from -Patient's family again stated that patient wanted to be a "functioning member of the society".  We will continue to assess neurological status every day with family to help with goals of care -Continue seizure precautions -Management of rest of comorbidities per primary team   CRITICAL CARE Performed by: Charlsie Quest     Total critical care time: 35 minutes   Critical care time was exclusive of separately billable procedures and treating other patients.   Critical care was necessary to treat or prevent imminent or life-threatening deterioration.   Critical care was time spent personally by me on the following activities: development of treatment plan with patient and/or surrogate as well as nursing, discussions with consultants, evaluation of patient's response to treatment, examination of patient, obtaining history from patient or surrogate, ordering and performing treatments and interventions, ordering and review of laboratory studies, ordering and review of radiographic studies, pulse oximetry and re-evaluation of  patient's condition.    Lindie Spruce Epilepsy [facility] For questions after 5pm please refer to AMION to reach the Neurologist on call

## 2020-08-18 NOTE — Plan of Care (Signed)

## 2020-08-18 NOTE — Progress Notes (Signed)
Pharmacy Antibiotic Note  Rachel Kane is a 60 y.o. female admitted on 08/11/2020 with aspiration pneumonia.  Pharmacy has been consulted for Unasyn dosing. WBC 14.9. Tmax 101.2. Note allergy listed for Amoxicillin as hives. Discussed allergy with Dr. Merrily Pew - He feels benefit outweighs risk at this time and wants to proceed with Unasyn therapy.  Plan: Unasyn  3g IV every 6 hours.  Monitor renal function, culture results, and clinical status.  Monitor for allergic reactions - MD aware of history of hives with amoxicillin.    Height: 5\' 4"  (162.6 cm) Weight: 77.8 kg (171 lb 8.3 oz) IBW/kg (Calculated) : 54.7  Temp (24hrs), Avg:100.7 F (38.2 C), Min:99.32 F (37.4 C), Max:103.1 F (39.5 C)  Recent Labs  Lab 08/11/2020 0550 08/20/2020 0600 08/08/2020 0923 08/18/2020 1305 08/18/20 0536  WBC 14.5*  --  11.9*  --  14.9*  CREATININE 1.27* 1.10* 1.24*  --  0.93  LATICACIDVEN 10.2*  --  1.9 1.5  --     Estimated Creatinine Clearance: 67.3 mL/min (by C-G formula based on SCr of 0.93 mg/dL).    Allergies  Allergen Reactions   Amoxicillin Hives   Sulfa Antibiotics Other (See Comments)    Childhood allergy-unknown    Antimicrobials this admission: 6/13 Azithromycin >>6/14 6/13 Ceftriaxone >>6/14 6/14 Unasyn >>  Dose adjustments this admission:   Microbiology results: 6/13 BCx >> 6/13 MRSA PCR negative 6/13 TA >>  Thank you for allowing pharmacy to be a part of this patient's care.  7/13, PharmD, BCPS, BCCCP Clinical Pharmacist Please refer to [facility] for [facility] Pharmacy numbers 08/18/2020 3:11 PM

## 2020-08-18 NOTE — Progress Notes (Signed)
NAME:  Rachel Kane, MRN:  [id number], DOB:  27-Dec-1960, LOS: 1 ADMISSION DATE:  09-14-2020, CONSULTATION DATE:  6/13 REFERRING MD:  Dr. Eudelia Bunch EDP, CHIEF COMPLAINT:  Cardiac arrest   History of Present Illness:  60 year old female with PMH as below, which is significant for NICM EF 35% followed by Dr. Eldridge Dace, but has not been seen in our health system since 2017. Family reports she has been off her medications since December. Family also reports drug and alcohol history. She was complaining of SOB, which prompted EMS call. Upon their arrival she was in significant distress and her RR dropped to 6. They began to deliver bag assisted breaths. Shortly after she suffered PEA arrest. Arrest was witnessed and ACLS was done for approximately 10 minutes with ROSC to sinus tach. She was intubated in the field. Upon arrival to the ED sedation was started. Laboratory evaluation significant for CO2 15, Glucose 324, Creatinine 1.27, Ca 8.4, WBC 15, BNP 818. Some evidence of seizure vs myoclonus in ED.  Pertinent  Medical History   has a past medical history of Esophageal atresia, Essential hypertension, Family history of adverse reaction to anesthesia, History of blood transfusion, History of pneumonia, Nonischemic cardiomyopathy (HCC), and Tobacco abuse.   Significant Hospital Events: Including procedures, antibiotic start and stop dates in addition to other pertinent events   6/13 admit, EEG in place, central line placed 6/14 EEG in place; CT and CXR show RLL opacity (likely pneumonia) started on Azithro and Ceftriaxone  Interim History / Subjective:   On EEG: no myoclonus/seizure activity since yesterday in ED On propofol and fentanyl Intubated on mech vent: breathing over; peep 8 and fio2 50% Nurse stopped sedation for neuro exam and states patient BP and RR increased. No neuro response change. UO -935 past 24 hours Left petechial rash noted on left forearm  Objective   Blood pressure (!)  151/78, pulse 78, temperature 100.04 F (37.8 C), resp. rate (!) 34, height 5\' 4"  (1.626 m), weight 77.8 kg, SpO2 92 %.    Vent Mode: PRVC FiO2 (%):  [50 %-100 %] 50 % Set Rate:  [30 bmp] 30 bmp Vt Set:  [440 mL] 440 mL PEEP:  [8 cmH20] 8 cmH20 Plateau Pressure:  [20 cmH20-23 cmH20] 20 cmH20   Intake/Output Summary (Last 24 hours) at 08/18/2020 0839 Last data filed at 08/18/2020 0400 Gross per 24 hour  Intake 2096.4 ml  Output 850 ml  Net 1246.4 ml    Filed Weights   2020/09/14 0600 08/18/20 0500  Weight: 81.6 kg 77.8 kg    Examination: General:  critically ill intubated patient on EEG monitoring HEENT: MM pink/moist; ETT and L IJ central line in place Neuro: On sedation; no corneal reflex, pupils 48mm non reactive to light; no response to painful stimuli; no cough/gag CV: s1s2, no m/r/g PULM: scattered rhonchi bs bilaterally; on mech vent PRVC 5 peep 50% GI: soft, bsx4 active  Extremities: warm/dry, no edema; new left forearm petechial rash Skin: no rashes or lesions  Labs/imaging that I havepersonally reviewed  (right click and "Reselect all SmartList Selections" daily)   K 3.3, mag 1.8 Co2 21 WBC 14.9 (from 11.9); fever trending down to 100 today from 103 yesterday  UDS: positive for benzo and opiates  CT chest: no PE; LVH; degree of underlying PAH; consolidation of RLL likely pneumonia CXR: Left IJ in correct position; opacification RLLL CT head: Focal abnormalities of the deep white matter, right more than left. Differential diagnosis  is small-vessel ischemic change versus demyelinating disease Echo: EF 35%, mod decreased LV function. Mod LVH. Grade 1 diastolic dysfunction.  EEG: pending results  Resolved Hospital Problem list     Assessment & Plan:   S/p Cardiac arrest 6/13: etiology unclear. PEA ~ 10 mins. Respiratory distress on EMS call. Has not been on CHF drug. BNP 800. Drug history.  CT head 6/13: Focal abnormalities of the deep white matter, right more  than left. Differential diagnosis is small-vessel ischemic change versus demyelinating disease Possible anoxic injury: sz vs myoclonus on exam.  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 -Neuro recommends MRI tomorrow and will wean off propofol -prn tylenol for fever -continue keppra -fentanyl for sedation; will wean off propofol  Acute on chronic HFrEF: LVEF 35%, NICM Plan: -will restart home lasix and carvedilol today -consider restarting Lisinopril tomorrow as BP allows -Consider cardiology consult  Acute hypoxemic respiratory failure RLL Pneumonia likely aspiration Plan: -continue mech vent 8cc/kg -started azithro/unasyn for aspiration pneumonia coverage -resp culture and BC pending; urine legionella and strep pneumonia sent -will stop solumedrol; continue yupelri, and brovana  Hypokalemia Plan: -replete K and mag -recheck BMP/mag later today -trend labs  Hyperglycemia without DM history Plan: - CBG monitoring and SSI   Best practice (right click and "Reselect all SmartList Selections" daily)  Diet:  NPO Pain/Anxiety/Delirium protocol (if indicated): Yes (RASS goal -1) VAP protocol (if indicated): Yes DVT prophylaxis: Subcutaneous Heparin GI prophylaxis: PPI Glucose control:  SSI Yes Central venous access:  Yes, and it is still needed Arterial line:  N/A Foley:  Yes, and it is still needed Mobility:  bed rest  PT consulted: N/A Last date of multidisciplinary goals of care discussion [ 6/13 Dr. Merrily Pew spoke with family; family does not want trach or PEG; switched to DNR] Code Status:  DNR Disposition: ICU   Critical care time: 35 min     JD Anselm [facility] 08/18/2020, 8:46 AM  Please see [email or web address] for pager details.  From 7A-7P if no response, please call [phone]. After hours, please call ELink [phone].

## 2020-08-19 ENCOUNTER — Inpatient Hospital Stay (HOSPITAL_COMMUNITY): Payer: 59

## 2020-08-19 DIAGNOSIS — J9602 Acute respiratory failure with hypercapnia: Secondary | ICD-10-CM

## 2020-08-19 LAB — GLUCOSE, CAPILLARY
Glucose-Capillary: 108 mg/dL — ABNORMAL HIGH (ref 70–99)
Glucose-Capillary: 115 mg/dL — ABNORMAL HIGH (ref 70–99)

## 2020-08-19 LAB — POCT I-STAT 7, (LYTES, BLD GAS, ICA,H+H)
Acid-base deficit: 1 mmol/L (ref 0.0–2.0)
Bicarbonate: 22.4 mmol/L (ref 20.0–28.0)
Calcium, Ion: 1.14 mmol/L — ABNORMAL LOW (ref 1.15–1.40)
HCT: 29 % — ABNORMAL LOW (ref 36.0–46.0)
Hemoglobin: 9.9 g/dL — ABNORMAL LOW (ref 12.0–15.0)
O2 Saturation: 97 %
Patient temperature: 36.8
Potassium: 4.2 mmol/L (ref 3.5–5.1)
Sodium: 138 mmol/L (ref 135–145)
TCO2: 23 mmol/L (ref 22–32)
pCO2 arterial: 32.5 mmHg (ref 32.0–48.0)
pH, Arterial: 7.446 (ref 7.350–7.450)
pO2, Arterial: 84 mmHg (ref 83.0–108.0)

## 2020-08-19 LAB — BASIC METABOLIC PANEL
Anion gap: 13 (ref 5–15)
BUN: 24 mg/dL — ABNORMAL HIGH (ref 6–20)
CO2: 21 mmol/L — ABNORMAL LOW (ref 22–32)
Calcium: 8.2 mg/dL — ABNORMAL LOW (ref 8.9–10.3)
Chloride: 106 mmol/L (ref 98–111)
Creatinine, Ser: 0.75 mg/dL (ref 0.44–1.00)
GFR, Estimated: >60 mL/min (ref >60)
Glucose, Bld: 122 mg/dL — ABNORMAL HIGH (ref 70–99)
Potassium: 4.1 mmol/L (ref 3.5–5.1)
Sodium: 140 mmol/L (ref 135–145)

## 2020-08-19 LAB — CBC
HCT: 33.2 % — ABNORMAL LOW (ref 36.0–46.0)
Hemoglobin: 11 g/dL — ABNORMAL LOW (ref 12.0–15.0)
MCH: 32.6 pg (ref 26.0–34.0)
MCHC: 33.1 g/dL (ref 30.0–36.0)
MCV: 98.5 fL (ref 80.0–100.0)
Platelets: 186 10*3/uL (ref 150–400)
RBC: 3.37 MIL/uL — ABNORMAL LOW (ref 3.87–5.11)
RDW: 12.7 % (ref 11.5–15.5)
WBC: 13.5 10*3/uL — ABNORMAL HIGH (ref 4.0–10.5)
nRBC: 0 % (ref 0.0–0.2)

## 2020-08-19 LAB — MAGNESIUM: Magnesium: 2.3 mg/dL (ref 1.7–2.4)

## 2020-08-19 LAB — PHOSPHORUS: Phosphorus: 3 mg/dL (ref 2.5–4.6)

## 2020-08-19 MED ORDER — GLYCOPYRROLATE 0.2 MG/ML IJ SOLN
0.1000 mg | Freq: Once | INTRAMUSCULAR | Status: AC
  Administered 2020-08-19: 0.1 mg via INTRAVENOUS
  Filled 2020-08-19: qty 1

## 2020-08-19 MED ORDER — LORAZEPAM 2 MG/ML IJ SOLN
2.0000 mg | INTRAMUSCULAR | Status: DC | PRN
  Administered 2020-08-19: 2 mg via INTRAVENOUS
  Filled 2020-08-19: qty 1

## 2020-08-19 MED ORDER — PANTOPRAZOLE SODIUM 40 MG PO PACK: 40.0000 mg | PACK | Freq: Every day | ORAL | Status: DC

## 2020-08-19 MED ORDER — THIAMINE HCL 100 MG PO TABS: 100.0000 mg | ORAL_TABLET | Freq: Every day | ORAL | Status: DC

## 2020-08-20 LAB — GLUCOSE, CAPILLARY: Glucose-Capillary: 113 mg/dL — ABNORMAL HIGH (ref 70–99)

## 2020-08-22 LAB — CULTURE, BLOOD (ROUTINE X 2)
Culture: NO GROWTH
Culture: NO GROWTH
Special Requests: ADEQUATE

## 2020-09-04 NOTE — Procedures (Addendum)
Patient Name: Rachel Kane  MRN: [id number]  Epilepsy Attending: Charlsie Quest  Referring Physician/Provider: Dr Levon Hedger Duration: 08/18/2020 1048 to 08/09/2020 0943   Patient history: 60 year old female status post cardiac arrest.  EEG to evaluate for seizures.   Level of alertness: Comatose   AEDs during EEG study: Propofol, LEV   Technical aspects: This EEG study was done with scalp electrodes positioned according to the 10-20 International system of electrode placement. Electrical activity was acquired at a sampling rate of 500Hz  and reviewed with a high frequency filter of 70Hz  and a low frequency filter of 1Hz . EEG data were recorded continuously and digitally stored.   Description: EEG initially showed intermittent sharply contoured 5 to 9 Hz theta-alpha activity lasting 2-4 seconds alternating with generalized suppression lasting 10-20 seconds.  As propofol was weaned after noon on 08/18/2020, EEG showed bursts of highly epileptiform discharges lasting 1-2 seconds alternating with EEG suppression lasting 4 to 6 seconds.  EEG was not reactive to tactile stimulation. Hyperventilation and photic stimulation were not performed.     Event button was pressed on 08/18/2020 at 1235. Per RN, propofol was being weaned when patient's eyes spontaneously opened and rolled back.  Concomitant EEG showed generalized bursts of epileptiform discharges consistent with myoclonic seizure.   ABNORMALITY - Myoclonic seizure, generalized - Burst suppression with highly epileptiform discharges, generalized   IMPRESSION: This study showed one myoclonic seizure on 08/18/2020 at 1235 when propofol was being weaned down.  After propofol was increased again, EEG showed evidence of epileptogenicity with generalized onset and high potential for seizures. Additionally, EEG is suggestive of profound diffuse encephalopathy, nonspecific etiology but could be related to sedation, anoxic/hypoxic brain injury.      [name] 08/20/2020

## 2020-09-04 NOTE — Death Summary Note (Signed)
DEATH SUMMARY   Patient Details  Name: Rachel Kane MRN: [id number] DOB: 07-23-1960  Admission/Discharge Information   Admit Date:  18-Aug-2020  Date of Death: Date of Death: 08/20/20  Time of Death: Time of Death: 1900  Length of Stay: 2  Referring Physician: Default, Provider, MD   Reason(s) for Hospitalization  s/p cardiorespiratory arrest in the setting of opiate overdose  Anoxic brain injury Myoclonus status epilepticus Chronic systolic congestive heart failure Acute hypoxic respiratory failure in the setting of aspiration pneumonia Hypokalemia AKI  Diagnoses  Preliminary cause of death:   Palliative extubation s/p cardiorespiratory arrest in the setting of opiate overdose  Anoxic brain injury Secondary Diagnoses (including complications and co-morbidities):  Active Problems:   Cardiac arrest [facility])   Brief Hospital Course (including significant findings, care, treatment, and services provided and events leading to death)  Rachel Kane is a 60 y.o. year old female who with PMH as below, which is significant for NICM EF 35% followed by Dr. Eldridge Dace, but has not been seen in our health system since 2017. Family reports she has been off her medications since December. Family also reports drug and alcohol history. She was complaining of SOB, which prompted EMS call. Upon their arrival she was in significant distress and her RR dropped to 6. They began to deliver bag assisted breaths. Shortly after she suffered PEA arrest. Arrest was witnessed and ACLS was done for approximately 10 minutes with ROSC to sinus tach. She was intubated in the field. Upon arrival to the ED sedation was started. Laboratory evaluation significant for CO2 15, Glucose 324, Creatinine 1.27, Ca 8.4, WBC 15, BNP 818. Some evidence of seizure vs myoclonus in ED.   Patient was admitted to ICU, placed on normothermia protocol.  Continuous video EEG was obtained, which showed status myoclonus, neurology was consulted,  patient was started on Keppra and propofol with improvement in status, frequently she was having some myoclonus still.  Her AKI and hypokalemia was corrected.  Even at admission patient neurological exam was poor with absent pupillary, corneal, cough, gag reflexes and absent motor response. Urine tox came back positive for opiate.  MRI of brain was done on 08-21-22 which was consistent with diffuse severe anoxic brain injury.  Patient's family was updated about MRI finding, and her exam was consistent with brain dead, patient's family decided to proceed with palliative extubation.  Patient was declared dead on August 20, 2020 at 7 PM, patient's family was at bedside      Pertinent Labs and Studies  Significant Diagnostic Studies DG Abd 1 View  Result Date: 08-20-20 CLINICAL DATA:  60 year old female status post CPR.  Query ileus. EXAM: ABDOMEN - 1 VIEW COMPARISON:  Portable chest and CTA chest August 18, 2020. FINDINGS: Portable AP supine view at 0928 hours. Paucity of bowel gas. Small volume of gas in the rectum. No dilated loops are evident. Other abdominal and pelvic visceral contours are within normal limits. Lower lumbar facet degeneration. No acute osseous abnormality identified. Asymmetric right lung base opacity persists. IMPRESSION: Mostly gasless abdomen.  No dilated bowel loops are evident. Electronically Signed   By: Odessa Fleming M.D.   On: 08/20/2020 10:11   CT HEAD WO CONTRAST  Result Date: 2020-08-18 CLINICAL DATA:  Delirium EXAM: CT HEAD WITHOUT CONTRAST TECHNIQUE: Contiguous axial images were obtained from the base of the skull through the vertex without intravenous contrast. COMPARISON:  None. FINDINGS: Brain: No abnormality is seen affecting the brainstem or cerebellum. There are a few areas of  low-density in the deep white matter, more normal verbal on the right than the left. Findings could relate to small vessel ischemic insults or conceivably demyelinating disease. No abnormality is visibly acute.  No mass, hemorrhage, hydrocephalus or extra-axial collection. Vascular: There is atherosclerotic calcification of the major vessels at the base of the brain. Skull: Negative Sinuses/Orbits: Mild mucosal thickening of the paranasal sinuses. No advanced disease. Orbits are negative. Other: None IMPRESSION: Focal abnormalities of the deep white matter, right more than left. Differential diagnosis is small-vessel ischemic change versus demyelinating disease. No definitely acute insult by CT. Electronically Signed   By: Paulina Fusi M.D.   On: 08/26/2020 08:14   CT Angio Chest Pulmonary Embolism (PE) W or WO Contrast  Result Date: 08/18/2020 CLINICAL DATA:  Respiratory distress with episode of asystole with CPR EXAM: CT ANGIOGRAPHY CHEST WITH CONTRAST TECHNIQUE: Multidetector CT imaging of the chest was performed using the standard protocol during bolus administration of intravenous contrast. Multiplanar CT image reconstructions and MIPs were obtained to evaluate the vascular anatomy. CONTRAST:  OMNIPAQUE IOHEXOL 350 MG/ML SOLN COMPARISON:  Chest radiograph September 03, 2020; CT angiogram chest August 12, 2015 FINDINGS: Cardiovascular: There is no demonstrable pulmonary embolus. No thoracic aortic aneurysm. No dissection evident. Note that the contrast bolus in the aorta is less than optimal for potential dissection assessment. There are scattered foci of calcification in visualized great vessels. There are foci of aortic atherosclerosis. There are foci of coronary artery calcification. There is left ventricular hypertrophy. No pericardial effusion or pericardial thickening. Main pulmonary outflow tract measures 3.2 cm in diameter. Mediastinum/Nodes: Thyroid appears normal. No appreciable thoracic adenopathy. There are scattered calcified mediastinal lymph nodes consistent with prior granulomatous disease. Nasogastric tube passes through the esophagus with tip in stomach. No esophageal lesions appreciable.  Lungs/Pleura: There is consolidation throughout the right lower lobe. There are scattered foci of atelectatic change in the right middle lobe. There is also atelectatic change in the inferior lingula and left lower lobe, most notably posteriorly in the left lower lobe. There is a degree of underlying centrilobular emphysematous change with scattered areas of mild mosaic attenuation, primarily in the right upper lobe. There is no evident pneumothorax. Patient is intubated with endotracheal tube tip in mid tracheal region. Trachea and major bronchial structures appear patent. No pleural effusions. Upper Abdomen: There is upper abdominal aortic atherosclerosis. Visualized upper abdominal structures unremarkable. Musculoskeletal: No blastic or lytic bone lesions. No evident chest wall lesions. Review of the MIP images confirms the above findings. IMPRESSION: 1. No evident pulmonary embolus. No thoracic aortic aneurysm. No thoracic dissection evident. Note that the contrast bolus in the aorta is less than optimal for potential dissection assessment. There is aortic atherosclerosis as well as foci of great vessel and coronary artery calcification. There is left ventricular hypertrophy. 2. Prominence of the main pulmonary outflow tract may indicate a degree of underlying pulmonary arterial hypertension. 3. Airspace consolidation throughout much of the right lower lobe consistent with pneumonia. Note that aspiration could present in this manner as well. Scattered areas of atelectatic change elsewhere. Underlying centrilobular emphysematous change. Areas of mosaic attenuation may indicate underlying small airways obstructive disease. No pleural effusions. 4. No evident adenopathy. Scattered foci of lymph node calcification consistent with prior granulomatous disease. 5. Endotracheal tube and nasogastric tube present as noted. No pneumothorax. Aortic Atherosclerosis (ICD10-I70.0) and Emphysema (ICD10-J43.9). Electronically  Signed   By: Bretta Bang III M.D.   On: 09/03/2020 08:24   MR BRAIN WO  CONTRAST  Result Date: 08/09/2020 CLINICAL DATA:  Anoxic brain injury. EXAM: MRI HEAD WITHOUT CONTRAST TECHNIQUE: Multiplanar, multiecho pulse sequences of the brain and surrounding structures were obtained without intravenous contrast. COMPARISON:  CT head 2020-08-26. FINDINGS: Brain: Diffuse restricted diffusion throughout the infratentorial and supratentorial gray matter (including cortex and deep gray nuclei), compatible with severe hypoxic/ischemic injury. Associated edema and diffuse sulcal effacement.New effacement of the basal cisterns with crowding of the posterior fossa and 6 mm of inferior tonsillar herniation. No hydrocephalus at this time. No acute hemorrhage. No extra-axial fluid collection. Additional scattered T2/FLAIR hyperintensities within the white matter bilaterally, which are age advanced and largely periventricular at the callososeptal interface. Vascular: Major arterial flow voids are maintained at the skull base. Skull and upper cervical spine: Normal marrow signal. Sinuses/Orbits: Mild scattered sinus mucosal thickening. No acute orbital abnormality. Other: Bilateral mastoid effusions. IMPRESSION: 1. Diffuse restricted diffusion throughout the infratentorial and supratentorial gray matter (including cortex and deep gray nuclei), compatible with severe hypoxic/ischemic injury. Associated edema and diffuse sulcal effacement with new effacement of the basal cisterns, crowding of the posterior fossa, and 6 mm of inferior cerebellar tonsillar herniation. 2. Additional age advanced T2/FLAIR hyperintensities within the white matter. The location of many of these lesions at the callososeptal interface is suspicious for demyelination. Additional differential considerations include chronic microvascular ischemic disease or prior infection/inflammation/trauma. These results will be called to the ordering clinician or  representative by the Radiologist Assistant, and communication documented in the PACS or [employer]. Electronically Signed   By: Feliberto Harts MD   On: 08/07/2020 12:36   DG CHEST PORT 1 VIEW  Result Date: 08-26-2020 CLINICAL DATA:  Central line placement EXAM: PORTABLE CHEST 1 VIEW COMPARISON:  08/26/20 FINDINGS: New left IJ central line tip overlies the cavoatrial junction. Endotracheal and enteric tubes are again identified. There is new opacification at the right lung base. No pneumothorax. Similar cardiomediastinal contours. IMPRESSION: Left IJ central line tip overlies cavoatrial junction. No pneumothorax. New right basilar atelectasis/consolidation with possible superimposed pleural effusion. Electronically Signed   By: Guadlupe Spanish M.D.   On: 2020/08/26 12:09   DG Chest Portable 1 View  Result Date: Aug 26, 2020 CLINICAL DATA:  Intubation.  CPR EXAM: PORTABLE CHEST 1 VIEW COMPARISON:  08/12/2015 FINDINGS: Borderline cardiomegaly which is similar to prior. Mild interstitial coarsening with postoperative changes on the right. Aeration is improved when compared to prior. Unremarkable aortic and hilar contours. There is artifact from EKG leads and defibrillator pads. The endotracheal tube tip is between the clavicular heads and carina. The enteric tube reaches the stomach. IMPRESSION: Unremarkable hardware positioning. No evidence of acute cardiopulmonary disease. Electronically Signed   By: Marnee Spring M.D.   On: 26-Aug-2020 06:01   Overnight EEG with video  Result Date: 08-26-2020 Charlsie Quest, MD     08/18/2020 12:41 PM Patient Name: ANJLI ROSSETTO MRN: [id number] Epilepsy Attending: Charlsie Quest Referring Physician/Provider: Dr Levon Hedger Duration: August 26, 2020 1048 to 08/18/2020 1048 Patient history: 60 year old female status post cardiac arrest.  EEG to evaluate for seizures. Level of alertness: Comatose AEDs during EEG study: Propofol, LEV Technical aspects: This EEG study  was done with scalp electrodes positioned according to the 10-20 International system of electrode placement. Electrical activity was acquired at a sampling rate of 500Hz  and reviewed with a high frequency filter of 70Hz  and a low frequency filter of 1Hz . EEG data were recorded continuously and digitally stored. Description: EEG initially showed burst suppression pattern  with highly epileptiform bursts lasting 2 to 4 seconds, occurring every 8 to 12 seconds.  Gradually, as propofol was increased the bursts improved and transitioned into intermittent sharply contoured 5 to 9 Hz theta-alpha activity lasting 2-4 seconds alternating with generalized suppression lasting EEG was not reactive to tactile stimulation.  Hyperventilation and photic stimulation were not performed.   ABNORMALITY - Burst suppression with highly epileptiform discharges, generalized - Intermittent slow, generalized - EEG suppression, generalized IMPRESSION: This study initially showed evidence of epileptogenicity with generalized onset and high potential for seizures. Gradually as propofol was increased, EEG was suggestive of severe diffuse encephalopathy, nonspecific etiology but could be related to sedation, anoxic/hypoxic brain injury.  No definite seizures were seen throughout the recording. Charlsie Quest   ECHOCARDIOGRAM COMPLETE  Result Date: 08/21/2020    ECHOCARDIOGRAM REPORT   Patient Name:   DONTE KARY Date of Exam: 08/05/2020 Medical Rec #:  [id number]    Height:       64.0 in Accession #:    [id number]   Weight:       180.0 lb Date of Birth:  [birth year]    BSA:          1.871 m Patient Age:    59 years     BP:           155/141 mmHg Patient Gender: F            HR:           107 bpm. Exam Location:  Inpatient Procedure: 2D Echo, Cardiac Doppler and Color Doppler  Results communicated to Dr Merrily Pew at 9:19am on 08/08/2020. Indications:    Cardiac arrest  History:        Patient has prior history of Echocardiogram examinations, most                  recent 08/13/2015. Risk Factors:Current Smoker and Hypertension.  Sonographer:    Shirlean Kelly Referring Phys: [id number] JULIAN F REESE  Sonographer Comments: Echo performed with patient supine and on artificial respirator. Image acquisition challenging due to respiratory motion. IMPRESSIONS  1. Left ventricular ejection fraction, by estimation, is 30 to 35%. The left ventricle has moderately decreased function. The left ventricle demonstrates global hypokinesis. The left ventricular internal cavity size was mildly dilated. There is moderate  left ventricular hypertrophy. Left ventricular diastolic parameters are consistent with Grade I diastolic dysfunction (impaired relaxation).  2. Right ventricular systolic function is normal. The right ventricular size is normal.  3. The mitral valve is normal in structure. Trivial mitral valve regurgitation.  4. The aortic valve was not well visualized. Aortic valve regurgitation is not visualized. No aortic stenosis is present. FINDINGS  Left Ventricle: Left ventricular ejection fraction, by estimation, is 30 to 35%. The left ventricle has moderately decreased function. The left ventricle demonstrates global hypokinesis. The left ventricular internal cavity size was mildly dilated. There is moderate left ventricular hypertrophy. Left ventricular diastolic parameters are consistent with Grade I diastolic dysfunction (impaired relaxation). Right Ventricle: The right ventricular size is normal. No increase in right ventricular wall thickness. Right ventricular systolic function is normal. Left [facility]: Left atrial size was normal in size. Right [facility]: Right atrial size was normal in size. Pericardium: Trivial pericardial effusion is present. Mitral Valve: The mitral valve is normal in structure. Trivial mitral valve regurgitation. Tricuspid Valve: The tricuspid valve is normal in structure. Tricuspid valve regurgitation is trivial. Aortic Valve: The aortic valve was  not well  visualized. Aortic valve regurgitation is not visualized. No aortic stenosis is present. Aortic valve mean gradient measures 3.0 mmHg. Aortic valve peak gradient measures 6.5 mmHg. Aortic valve area, by VTI measures 2.34 cm. Pulmonic Valve: The pulmonic valve was not well visualized. Pulmonic valve regurgitation is not visualized. Aorta: The aortic root is normal in size and structure. Venous: IVC assessment for right atrial pressure unable to be performed due to mechanical ventilation. IAS/Shunts: The interatrial septum was not well visualized.  LEFT VENTRICLE PLAX 2D LVIDd:         5.60 cm  Diastology LVIDs:         4.80 cm  LV e' medial:    4.13 cm/s LV PW:         1.30 cm  LV E/e' medial:  11.6 LV IVS:        0.90 cm  LV e' lateral:   4.68 cm/s LVOT diam:     2.20 cm  LV E/e' lateral: 10.3 LV SV:         45 LV SV Index:   24 LVOT Area:     3.80 cm  RIGHT VENTRICLE RV S prime:     12.90 cm/s TAPSE (M-mode): 2.0 cm LEFT ATRIUM             Index       RIGHT ATRIUM           Index LA diam:        3.10 cm 1.66 cm/m  RA Area:     10.80 cm LA Vol (A2C):   58.2 ml 31.11 ml/m RA Volume:   20.80 ml  11.12 ml/m LA Vol (A4C):   58.9 ml 31.49 ml/m LA Biplane Vol: 61.4 ml 32.82 ml/m  AORTIC VALVE AV Area (Vmax):    2.30 cm AV Area (Vmean):   2.19 cm AV Area (VTI):     2.34 cm AV Vmax:           127.00 cm/s AV Vmean:          82.500 cm/s AV VTI:            0.193 m AV Peak Grad:      6.5 mmHg AV Mean Grad:      3.0 mmHg LVOT Vmax:         76.70 cm/s LVOT Vmean:        47.600 cm/s LVOT VTI:          0.119 m LVOT/AV VTI ratio: 0.62  AORTA Ao Root diam: 2.20 cm MITRAL VALVE               TRICUSPID VALVE MV Area (PHT): 4.80 cm    TR Peak grad:   31.6 mmHg MV Decel Time: 158 msec    TR Vmax:        281.00 cm/s MV E velocity: 48.00 cm/s MV A velocity: 70.30 cm/s  SHUNTS MV E/A ratio:  0.68        Systemic VTI:  0.12 m                            Systemic Diam: 2.20 cm Epifanio Lesches MD Electronically signed  by Epifanio Lesches MD Signature Date/Time: 08/20/2020/9:20:28 AM    Final     Microbiology Recent Results (from the past 240 hour(s))  Resp Panel by RT-PCR (Flu A&B, Covid) Nasopharyngeal Swab     Status: None   Collection Time: 09/03/2020  5:51 AM   Specimen: Nasopharyngeal Swab; Nasopharyngeal(NP) swabs in vial transport medium  Result Value Ref Range Status   SARS Coronavirus 2 by RT PCR NEGATIVE NEGATIVE Final    Comment: (NOTE) SARS-CoV-2 target nucleic acids are NOT DETECTED.  The SARS-CoV-2 RNA is generally detectable in upper respiratory specimens during the acute phase of infection. The lowest concentration of SARS-CoV-2 viral copies this assay can detect is 138 copies/mL. A negative result does not preclude SARS-Cov-2 infection and should not be used as the sole basis for treatment or other patient management decisions. A negative result may occur with  improper specimen collection/handling, submission of specimen other than nasopharyngeal swab, presence of viral mutation(s) within the areas targeted by this assay, and inadequate number of viral copies(<138 copies/mL). A negative result must be combined with clinical observations, patient history, and epidemiological information. The expected result is Negative.  Fact Sheet for Patients:  BloggerCourse.com  Fact Sheet for Healthcare Providers:  SeriousBroker.it  This test is no t yet approved or cleared by the [place] FDA and  has been authorized for detection and/or diagnosis of SARS-CoV-2 by FDA under an Emergency Use Authorization (EUA). This EUA will remain  in effect (meaning this test can be used) for the duration of the COVID-19 declaration under Section 564(b)(1) of the Act, 21 U.S.C.section 360bbb-3(b)(1), unless the authorization is terminated  or revoked sooner.       Influenza A by PCR NEGATIVE NEGATIVE Final   Influenza B by PCR NEGATIVE NEGATIVE  Final    Comment: (NOTE) The Xpert Xpress SARS-CoV-2/FLU/RSV plus assay is intended as an aid in the diagnosis of influenza from Nasopharyngeal swab specimens and should not be used as a sole basis for treatment. Nasal washings and aspirates are unacceptable for Xpert Xpress SARS-CoV-2/FLU/RSV testing.  Fact Sheet for Patients: BloggerCourse.com  Fact Sheet for Healthcare Providers: SeriousBroker.it  This test is not yet approved or cleared by the [place] FDA and has been authorized for detection and/or diagnosis of SARS-CoV-2 by FDA under an Emergency Use Authorization (EUA). This EUA will remain in effect (meaning this test can be used) for the duration of the COVID-19 declaration under Section 564(b)(1) of the Act, 21 U.S.C. section 360bbb-3(b)(1), unless the authorization is terminated or revoked.  Performed at [facility], [address] [place], [place] [address]   MRSA Next Gen by PCR, Nasal     Status: None   Collection Time: 08/22/2020  8:15 AM  Result Value Ref Range Status   MRSA by PCR Next Gen NOT DETECTED NOT DETECTED Final    Comment: (NOTE) The GeneXpert MRSA Assay (FDA approved for NASAL specimens only), is one component of a comprehensive MRSA colonization surveillance program. It is not intended to diagnose MRSA infection nor to guide or monitor treatment for MRSA infections. Test performance is not FDA approved in patients less than 18 years old. Performed at [facility], [address], [place], [place] [address]   Culture, blood (routine x 2)     Status: None (Preliminary result)   Collection Time: 08/23/2020  9:23 AM   Specimen: BLOOD  Result Value Ref Range Status   Specimen Description BLOOD LEFT ANTECUBITAL  Final   Special Requests   Final    BOTTLES DRAWN AEROBIC AND ANAEROBIC Blood Culture adequate volume   Culture   Final    NO GROWTH 3 DAYS Performed at [facility], [address], [place], [place] [address]  Report Status PENDING  Incomplete  Culture, blood (routine x 2)     Status: None (Preliminary result)   Collection Time: 08/15/2020  9:33 AM   Specimen: BLOOD LEFT FOREARM  Result Value Ref Range Status   Specimen Description BLOOD LEFT FOREARM  Final   Special Requests   Final    BOTTLES DRAWN AEROBIC ONLY Blood Culture results may not be optimal due to an inadequate volume of blood received in culture bottles   Culture   Final    NO GROWTH 3 DAYS Performed at [facility], [address] [place], [place] [address]    Report Status PENDING  Incomplete    Lab Basic Metabolic Panel: Recent Labs  Lab 08/30/2020 0550 08/18/2020 0600 08/05/2020 0648 08/22/2020 0923 08/14/2020 1203 08/18/20 0536 08/18/20 1530 November 13, 2020 0527 November 13, 2020 0943  NA 136 138   < >  --  140 138 137 140 138  K 4.3 4.4   < >  --  3.8 3.3* 3.7 4.1 4.2  CL 102 105  --   --   --  106 106 106  --   CO2 15*  --   --   --   --  21* 21* 21*  --   GLUCOSE 324* 318*  --   --   --  129* 136* 122*  --   BUN 9 7  --   --   --  19 19 24*  --   CREATININE 1.27* 1.10*  --  1.24*  --  0.93 0.87 0.75  --   CALCIUM 8.4*  --   --   --   --  8.2* 8.5* 8.2*  --   MG  --   --   --  2.2  --  1.8 2.5* 2.3  --   PHOS  --   --   --  5.2*  --  4.4  --  3.0  --    < > = values in this interval not displayed.   Liver Function Tests: Recent Labs  Lab 08/12/2020 0550  AST 41  ALT 18  ALKPHOS 94  BILITOT 0.7  PROT 6.3*  ALBUMIN 3.0*   No results for input(s): LIPASE, AMYLASE in the last 168 hours. No results for input(s): AMMONIA in the last 168 hours. CBC: Recent Labs  Lab 08/10/2020 0550 08/18/2020 0600 08/11/2020 0923 08/30/2020 1203 08/18/20 0536 November 13, 2020 0527 November 13, 2020 0943  WBC 14.5*  --  11.9*  --  14.9* 13.5*  --   NEUTROABS 9.0*  --   --   --   --   --   --   HGB 13.9   < > 13.8 12.6 12.3 11.0* 9.9*  HCT 43.5   < > 42.4 37.0 37.5 33.2* 29.0*  MCV 102.4*  --  99.5  --  98.2  98.5  --   PLT 268  --  184  --  193 186  --    < > = values in this interval not displayed.   Cardiac Enzymes: No results for input(s): CKTOTAL, CKMB, CKMBINDEX, TROPONINI in the last 168 hours. Sepsis Labs: Recent Labs  Lab 08/24/2020 0550 08/26/2020 0923 08/27/2020 1305 08/18/20 0536 November 13, 2020 0527  WBC 14.5* 11.9*  --  14.9* 13.5*  LATICACIDVEN 10.2* 1.9 1.5  --   --     Procedures/Operations     [employer]Gard[employer]ham Allyson Tineo 08/20/2020, 10:08 AM

## 2020-09-04 NOTE — Progress Notes (Signed)
Discontiued cEEG study.  Removed EEG electrodes.  No skin breakdown observed.   Notified [facility] monitoring.

## 2020-09-04 NOTE — Progress Notes (Signed)
NAME:  Rachel Kane, MRN:  [id number], DOB:  1961/02/27, LOS: 2 ADMISSION DATE:  08/07/2020, CONSULTATION DATE:  6/13 REFERRING MD:  Dr. Eudelia Bunch EDP, CHIEF COMPLAINT:  Cardiac arrest   History of Present Illness:  60 year old female with PMH as below, which is significant for NICM EF 35% followed by Dr. Eldridge Dace, but has not been seen in our health system since 2017. Family reports she has been off her medications since December. Family also reports drug and alcohol history. She was complaining of SOB, which prompted EMS call. Upon their arrival she was in significant distress and her RR dropped to 6. They began to deliver bag assisted breaths. Shortly after she suffered PEA arrest. Arrest was witnessed and ACLS was done for approximately 10 minutes with ROSC to sinus tach. She was intubated in the field. Upon arrival to the ED sedation was started. Laboratory evaluation significant for CO2 15, Glucose 324, Creatinine 1.27, Ca 8.4, WBC 15, BNP 818. Some evidence of seizure vs myoclonus in ED.    Pertinent  Medical History   has a past medical history of Esophageal atresia, Essential hypertension, Family history of adverse reaction to anesthesia, History of blood transfusion, History of pneumonia, Nonischemic cardiomyopathy (HCC), and Tobacco abuse.   Significant Hospital Events: Including procedures, antibiotic start and stop dates in addition to other pertinent events   6/13 admit, EEG in place, central line placed 6/14 EEG in place; CT and CXR show RLL opacity (likely pneumonia) started on Azithro and Ceftriaxone 6/15 Brain MRI to assess for anoxic injury, EEG complete  Interim History / Subjective:  Febrile to 100.9 overnight.  Attempt to wean propofol yesterday resulted in tachypnea to 40s and twitching around patient eye concerning for seizure.  Continues on 200 mcg/hr of fentanyl for sedation in addition to 40 mcg propofol.  Able to decrease FiO2 overnight from 60% to 50%, on 40% FiO2 with  PEEP of 5 this AM.  Difficult to assess neuro status while on sedation since going to MRI this morning.  Objective   Blood pressure 136/61, pulse (!) 55, temperature 98.42 F (36.9 C), temperature source Bladder, resp. rate (!) 30, height 5\' 4"  (1.626 m), weight 73.5 kg, SpO2 100 %.    Vent Mode: PRVC FiO2 (%):  [40 %-60 %] 50 % Set Rate:  [30 bmp] 30 bmp Vt Set:  [440 mL] 440 mL PEEP:  [5 cmH20] 5 cmH20 Plateau Pressure:  [18 cmH20-22 cmH20] 18 cmH20   Intake/Output Summary (Last 24 hours) at 09-16-20 0813 Last data filed at 09-16-2020 0100 Gross per 24 hour  Intake 1235.65 ml  Output 880 ml  Net 355.65 ml    Filed Weights   08/25/2020 0600 08/18/20 0500 2020-09-16 0500  Weight: 81.6 kg 77.8 kg 73.5 kg    Examination: General:  critically ill intubated patient on EEG monitoring HEENT: MM pink/moist; ETT and L IJ central line in place Neuro: On sedation; no corneal reflex, pupils 58mm non reactive to light; no response to painful stimuli  CV: bradycardic (50s), no m/r/g PULM: CTAB; on mech vent PRVC 5 peep 40% GI: soft, non-distended Extremities: warm/dry, no edema; left forearm petechial rash   Labs/imaging that I havepersonally reviewed  (right click and "Reselect all SmartList Selections" daily)  CMP, CBC, ABG  Micro results (BCx, Strep pneumo)  EEG results: This study showed one myoclonic seizure on 08/18/2020 at 1235 when propofol was being weaned down.  After propofol was increased again, EEG showed evidence of  epileptogenicity with generalized onset and high potential for seizures. Additionally, EEG is suggestive of profound diffuse encephalopathy, nonspecific etiology but could be related to sedation, anoxic/hypoxic brain injury.     Resolved Hospital Problem list    Assessment & Plan:  S/p Cardiac arrest 6/13: etiology unclear. PEA ~ 10 mins. Respiratory distress on EMS call. Has not been on CHF drugs since December 2021.  BNP 800.  Substance use history.  CT head  6/13: Focal abnormalities of the deep white matter, right more than left. Differential diagnosis is small-vessel ischemic change versus demyelinating disease Possible anoxic injury: sz vs myoclonus on exam.  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 although nonspecific, one myoclonic seizure on 6/14 -Will go for brain MRI today -prn tylenol for fever -continue keppra -fentanyl for sedation; will keep propofol at current dose for now, neuro following   Acute on chronic HFrEF: LVEF 35%, NICM Plan: -restarted home lasix and carvedilol yesterday, will continue to monitor vitals  -consider restarting Lisinopril as BP allows, currently normotensive  -Consider cardiology consult  Acute hypoxemic respiratory failure RLL Pneumonia likely aspiration Plan: -continue mech vent 8cc/kg -started azithro/unasyn for aspiration pneumonia coverage, day 2 -resp culture and BC pending and NGTD; urine strep pneumo neg and legionella pending -continue yupelri, and brovana  Hypokalemia, K 4.1 today Plan: -continue to trend labs  Hyperglycemia without DM history Plan: - CBG monitoring and SSI   Best practice (right click and "Reselect all SmartList Selections" daily)  Diet:  NPO Pain/Anxiety/Delirium protocol (if indicated): Yes (RASS goal -1) VAP protocol (if indicated): Yes DVT prophylaxis: Subcutaneous Heparin GI prophylaxis: PPI Glucose control:  SSI Yes Central venous access:  Yes, and it is still needed Arterial line:  N/A Foley:  Yes, and it is still needed Mobility:  bed rest  PT consulted: N/A Last date of multidisciplinary goals of care discussion [ 6/13 Dr. Merrily Pew spoke with family; family does not want trach or PEG; switched to DNR] Code Status:  DNR Disposition: ICU       Park Liter, MS4

## 2020-09-04 NOTE — Progress Notes (Signed)
Pt transported on ventilator to MRI and returned to 2H17 w/o complications.

## 2020-09-04 NOTE — Progress Notes (Signed)
Nutrition Brief Note  Chart reviewed. Pt triggered for assessment secondary to vent status.  Pt now transitioning to comfort care.  No nutrition interventions planned at this time.  Please re-consult as needed.   Romelle Starcher MS, RDN, LDN, CNSC Registered Dietitian III Clinical Nutrition RD Pager and On-Call Pager Number Located in [place]

## 2020-09-04 NOTE — Progress Notes (Signed)
Subjective: Had worsening EEG overnight. MRI today  ROS: Unable to obtain due to poor mental status  Examination  Vital signs in last 24 hours: Temp:  [98.06 F (36.7 C)-101.12 F (38.4 C)] 98.06 F (36.7 C) (06/15 1030) Pulse Rate:  [55-93] 55 (06/15 0200) Resp:  [27-40] 30 (06/15 1030) BP: (109-184)/(49-103) 116/67 (06/15 1030) SpO2:  [93 %-100 %] 100 % (06/15 1030) FiO2 (%):  [40 %-60 %] 50 % (06/15 0939) Weight:  [73.5 kg] 73.5 kg (06/15 0500)  General: lying in bed, not in apparent distress CVS: pulse-normal rate and rhythm RS: Intubated, coarse breath sounds bilaterally, does not initiate breaths abdomen Extremities: normal, warm Neuro: On propofol at 40 MCG per hour, comatose, does not open eyes noxious stimuli, PERRLA, corneal reflex absent, gag reflex absent, does not withdraw to noxious stimuli   Basic Metabolic Panel: Recent Labs  Lab 08/11/2020 0550 08/13/2020 0600 08/27/2020 0648 08/21/2020 0923 08/05/2020 1203 08/18/20 0536 08/18/20 1530 Aug 27, 2020 0527 Aug 27, 2020 0943  NA 136 138   < >  --  140 138 137 140 138  K 4.3 4.4   < >  --  3.8 3.3* 3.7 4.1 4.2  CL 102 105  --   --   --  106 106 106  --   CO2 15*  --   --   --   --  21* 21* 21*  --   GLUCOSE 324* 318*  --   --   --  129* 136* 122*  --   BUN 9 7  --   --   --  19 19 24*  --   CREATININE 1.27* 1.10*  --  1.24*  --  0.93 0.87 0.75  --   CALCIUM 8.4*  --   --   --   --  8.2* 8.5* 8.2*  --   MG  --   --   --  2.2  --  1.8 2.5* 2.3  --   PHOS  --   --   --  5.2*  --  4.4  --  3.0  --    < > = values in this interval not displayed.    CBC: Recent Labs  Lab 08/21/2020 0550 08/08/2020 0600 08/08/2020 0923 08/14/2020 1203 08/18/20 0536 08-27-20 0527 08/27/2020 0943  WBC 14.5*  --  11.9*  --  14.9* 13.5*  --   NEUTROABS 9.0*  --   --   --   --   --   --   HGB 13.9   < > 13.8 12.6 12.3 11.0* 9.9*  HCT 43.5   < > 42.4 37.0 37.5 33.2* 29.0*  MCV 102.4*  --  99.5  --  98.2 98.5  --   PLT 268  --  184  --  193 186  --     < > = values in this interval not displayed.     Coagulation Studies: Recent Labs    08/31/2020 0550  LABPROT 14.1  INR 1.1    Imaging MRI Brain wo contrast Aug 27, 2020:  Diffuse restricted diffusion throughout the infratentorial and supratentorial gray matter (including cortex and deep gray nuclei), compatible with severe hypoxic/ischemic injury. Associated edema and diffuse sulcal effacement with new effacement of the basal cisterns, crowding of the posterior fossa, and 6 mm of inferior cerebellar tonsillar herniation. 2. Additional age advanced T2/FLAIR hyperintensities within the white matter. The location of many of these lesions at the callososeptal interface is suspicious for demyelination. Additional differential considerations  include chronic microvascular ischemic disease or prior infection/inflammation/trauma.      ASSESSMENT AND PLAN: 60 year old female status post cardiorespiratory arrest, 10 minutes to ROSC.  He was noted to be in status myoclonus on arrival which improved after starting Keppra and propofol.   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  After propofol was increased again, EEG showed evidence of epileptogenicity with generalized onset and high potential for seizures. Additionally, EEG is suggestive of profound diffuse encephalopathy, nonspecific etiology but could be related to sedation, anoxic/hypoxic brain injury.    Recommendations: -Continue Keppra 1000 mg twice daily and propofol at current rate -Discussed MRI and eeg findings along with exam with patient's family. I suspect patient has had significant neurological injury with minimal to no chances of meaningful neurologic recovery.  Patient's family states patient had clear wishes and valued independence. Therefore, we will transition  patient to comfort care after family members have had a chance to see the patient -Continue seizure precautions -Management of rest of comorbidities per primary team  I have spent a total of  35 minutes with the patient reviewing hospital notes,  test results, labs and examining the patient as well as establishing an assessment and plan that was discussed personally with the patient's family at bedside.  > 50% of time was spent in direct patient care.     Lindie Spruce Epilepsy [facility] For questions after 5pm please refer to AMION to reach the Neurologist on call

## 2020-09-04 NOTE — Plan of Care (Signed)
Patient of MRI brain, consistent with extensive anoxic brain injury. I discussion with patient's mother and patient's son at bedside, updated about patient's condition and MRI results. Considering poor prognosis, patient's family decided to proceed with comfort care and palliative extubation at some point tomorrow. Will continue ventilatory management for now, continue with fentanyl and plan for palliative extubation.    Cheri Fowler MD [facility] See Amion for pager If no response to pager, please call [phone] until 7pm After 7pm, Please call E-link [phone]

## 2020-09-04 DEATH — deceased

## 2022-11-15 IMAGING — CT CT HEAD W/O CM
4 series · 16 of 47 positions shown, 18 images · non-contrast
Comparison: None.

CLINICAL DATA: Delirium

EXAM:
CT HEAD WITHOUT CONTRAST
TECHNIQUE: Contiguous axial images were obtained from the base of the skull
through the vertex without intravenous contrast.

[Series 3: head without · axial · non-contrast · 0.45mm/px · z∈[-77,+43]mm · 7 of 34 slices shown, 9 images]
[im 5/34  brain]
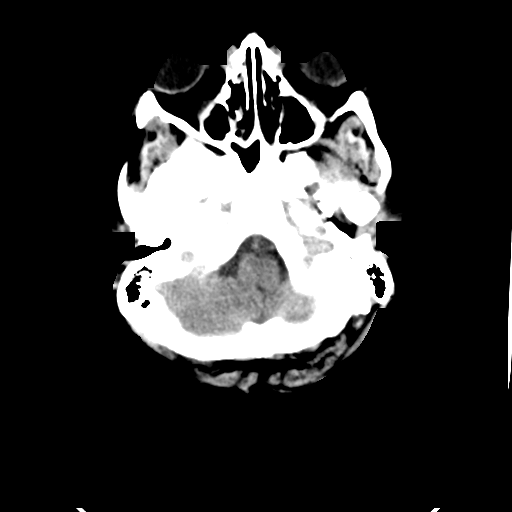
[im 5/34  bone]
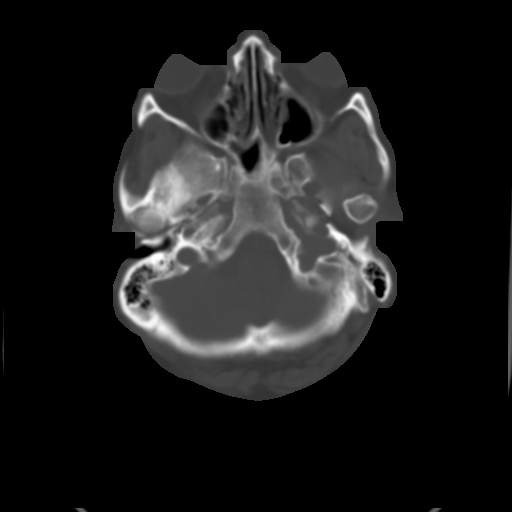
[im 9/34  brain]
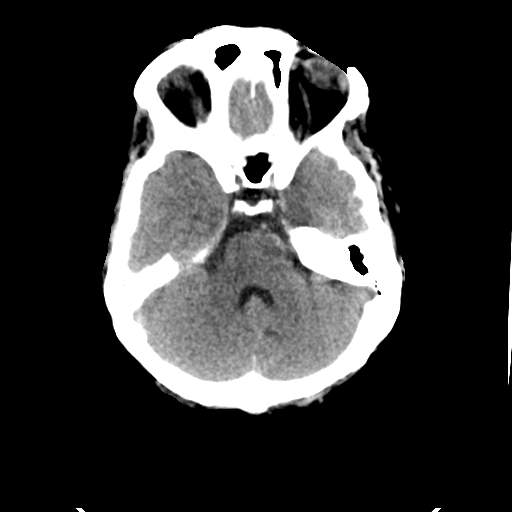
[im 13/34  brain]
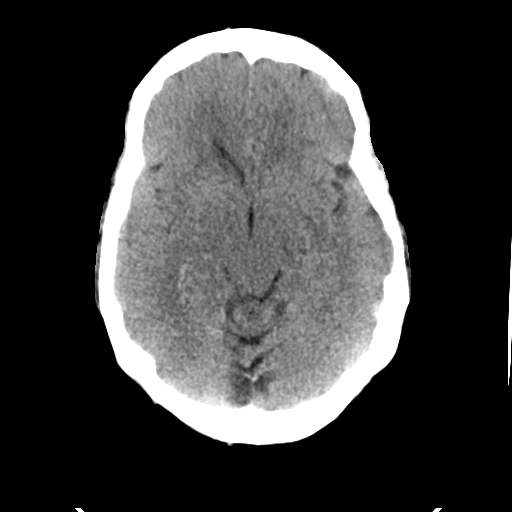
[im 17/34  brain]
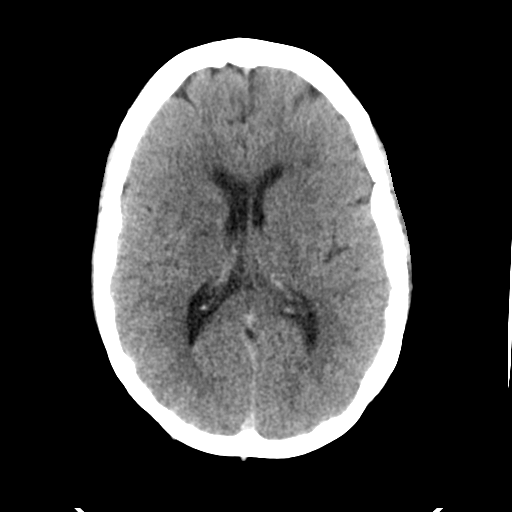
[im 21/34  brain]
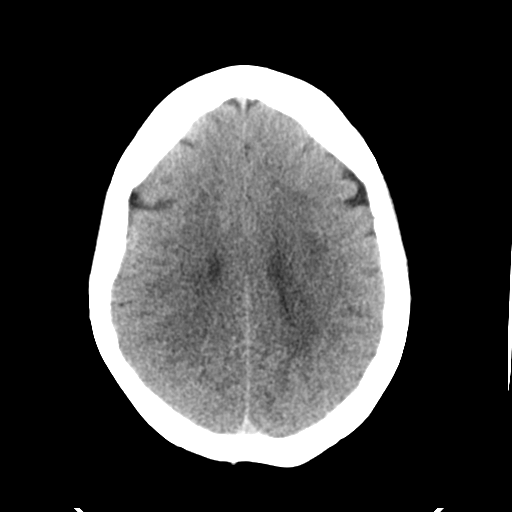
[im 21/34  bone]
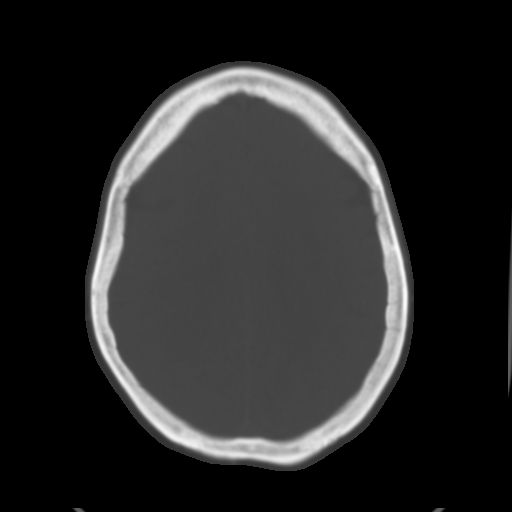
[im 25/34  brain]
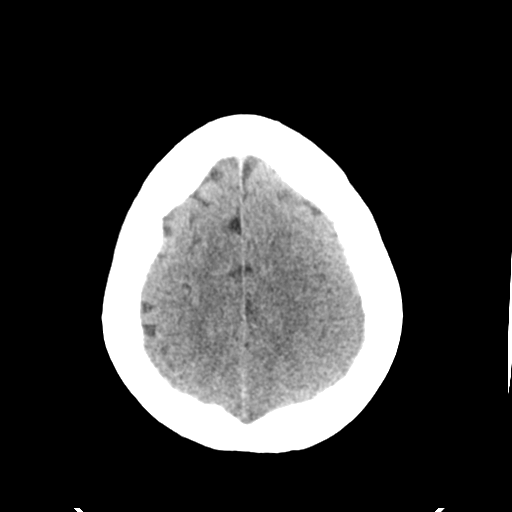
[im 29/34  brain]
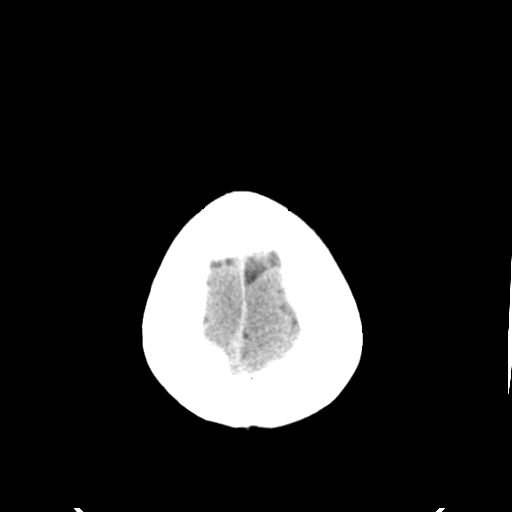

[Series 4: ax head bone · axial · 0.42mm/px · z∈[-73,-43]mm · 3 of 81 slices shown]
[im 9/81  bone]
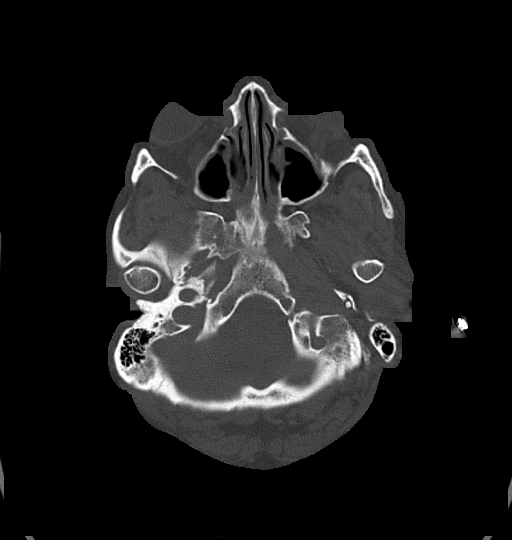
[im 17/81  bone]
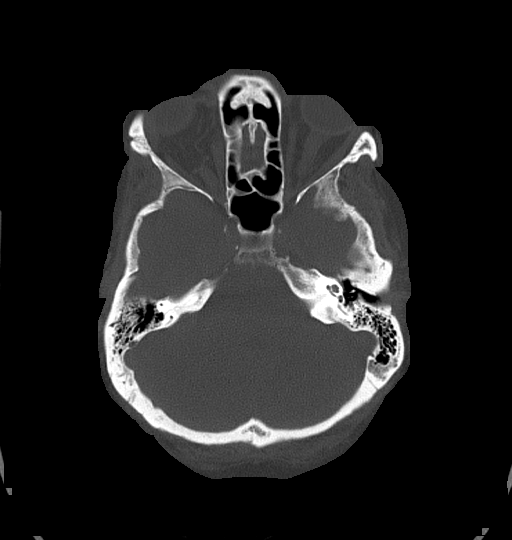
[im 25/81  bone]
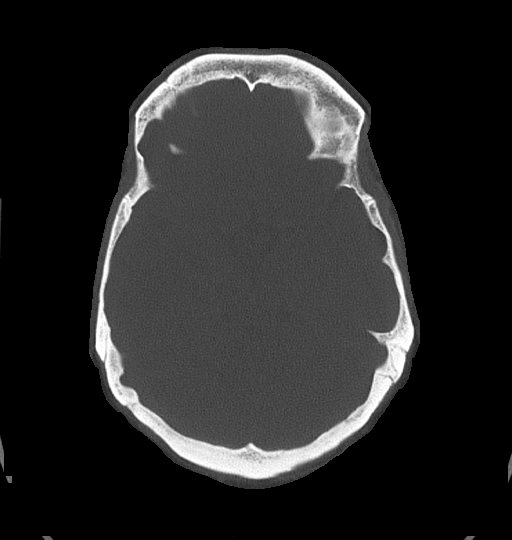

[Series 5: head without cor · coronal · non-contrast · 0.32mm/px · 3 of 67 slices shown]
[im 23/67  brain]
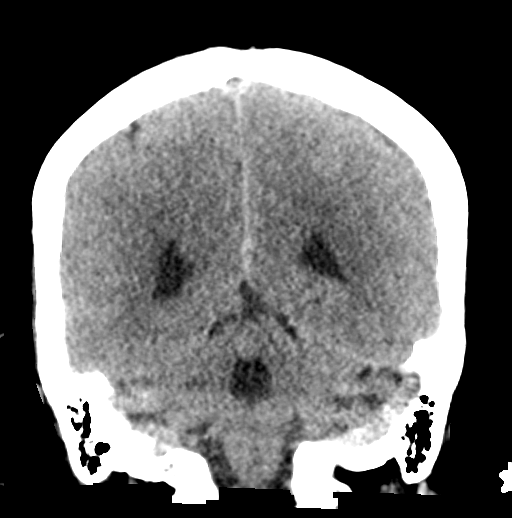
[im 30/67  brain]
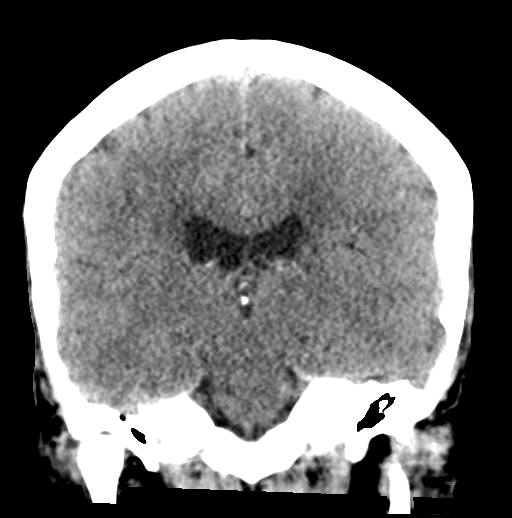
[im 37/67  brain]
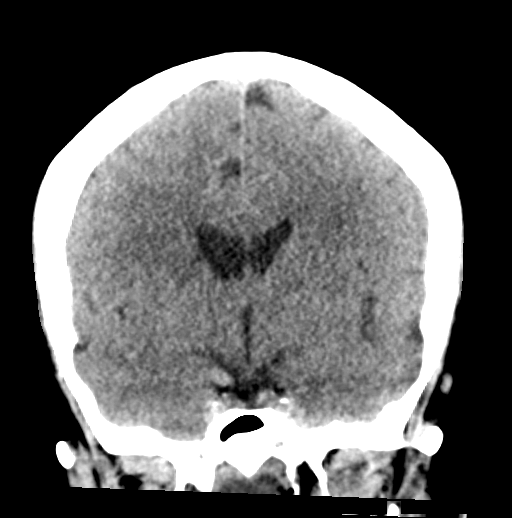

[Series 6: head without sag · sagittal · non-contrast · 0.32mm/px · 3 of 49 slices shown]
[im 17/49  brain]
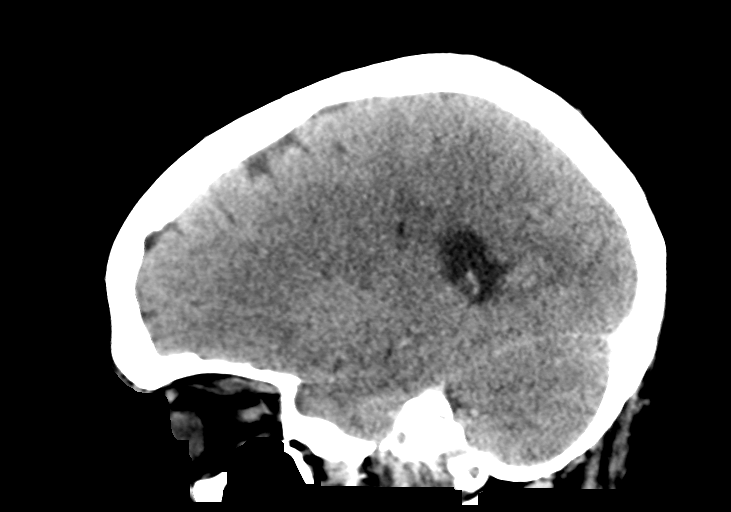
[im 25/49  brain]
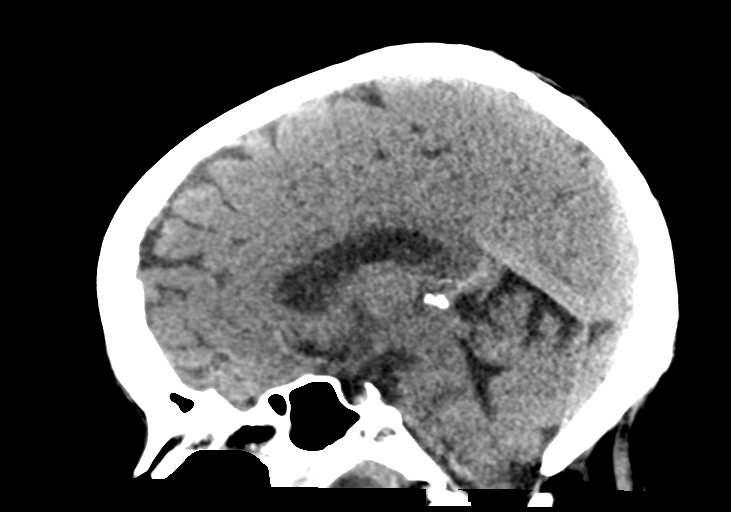
[im 33/49  brain]
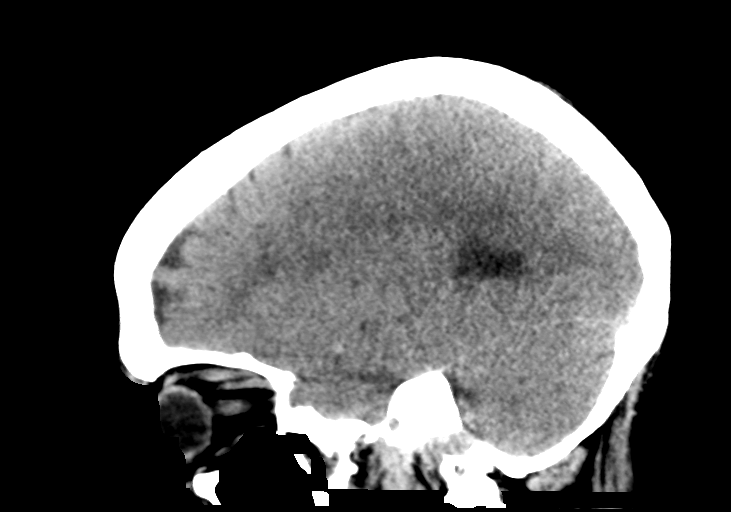

[16 of 47 positions shown; findings below may reference images not displayed]

FINDINGS: Brain: No abnormality is seen affecting the brainstem or cerebellum.
There are a few areas of low-density in the deep white matter, more
normal verbal on the right than the left. Findings could relate to
small vessel ischemic insults or conceivably demyelinating disease.
No abnormality is visibly acute. No mass, hemorrhage, hydrocephalus
or extra-axial collection.

Vascular: There is atherosclerotic calcification of the major
vessels at the base of the brain.

Skull: Negative

Sinuses/Orbits: Mild mucosal thickening of the paranasal sinuses. No
advanced disease. Orbits are negative.

Other: None
IMPRESSION: Focal abnormalities of the deep white matter, right more than left.
Differential diagnosis is small-vessel ischemic change versus
demyelinating disease. No definitely acute insult by CT.

## 2022-11-17 IMAGING — DX DG ABDOMEN 1V
1 series · 1 of 1 positions shown · non-contrast
Comparison: Portable chest and CTA chest 08/17/2020.

CLINICAL DATA: 59-year-old female status post CPR.  Query ileus.

EXAM:
ABDOMEN - 1 VIEW

[abdomen]
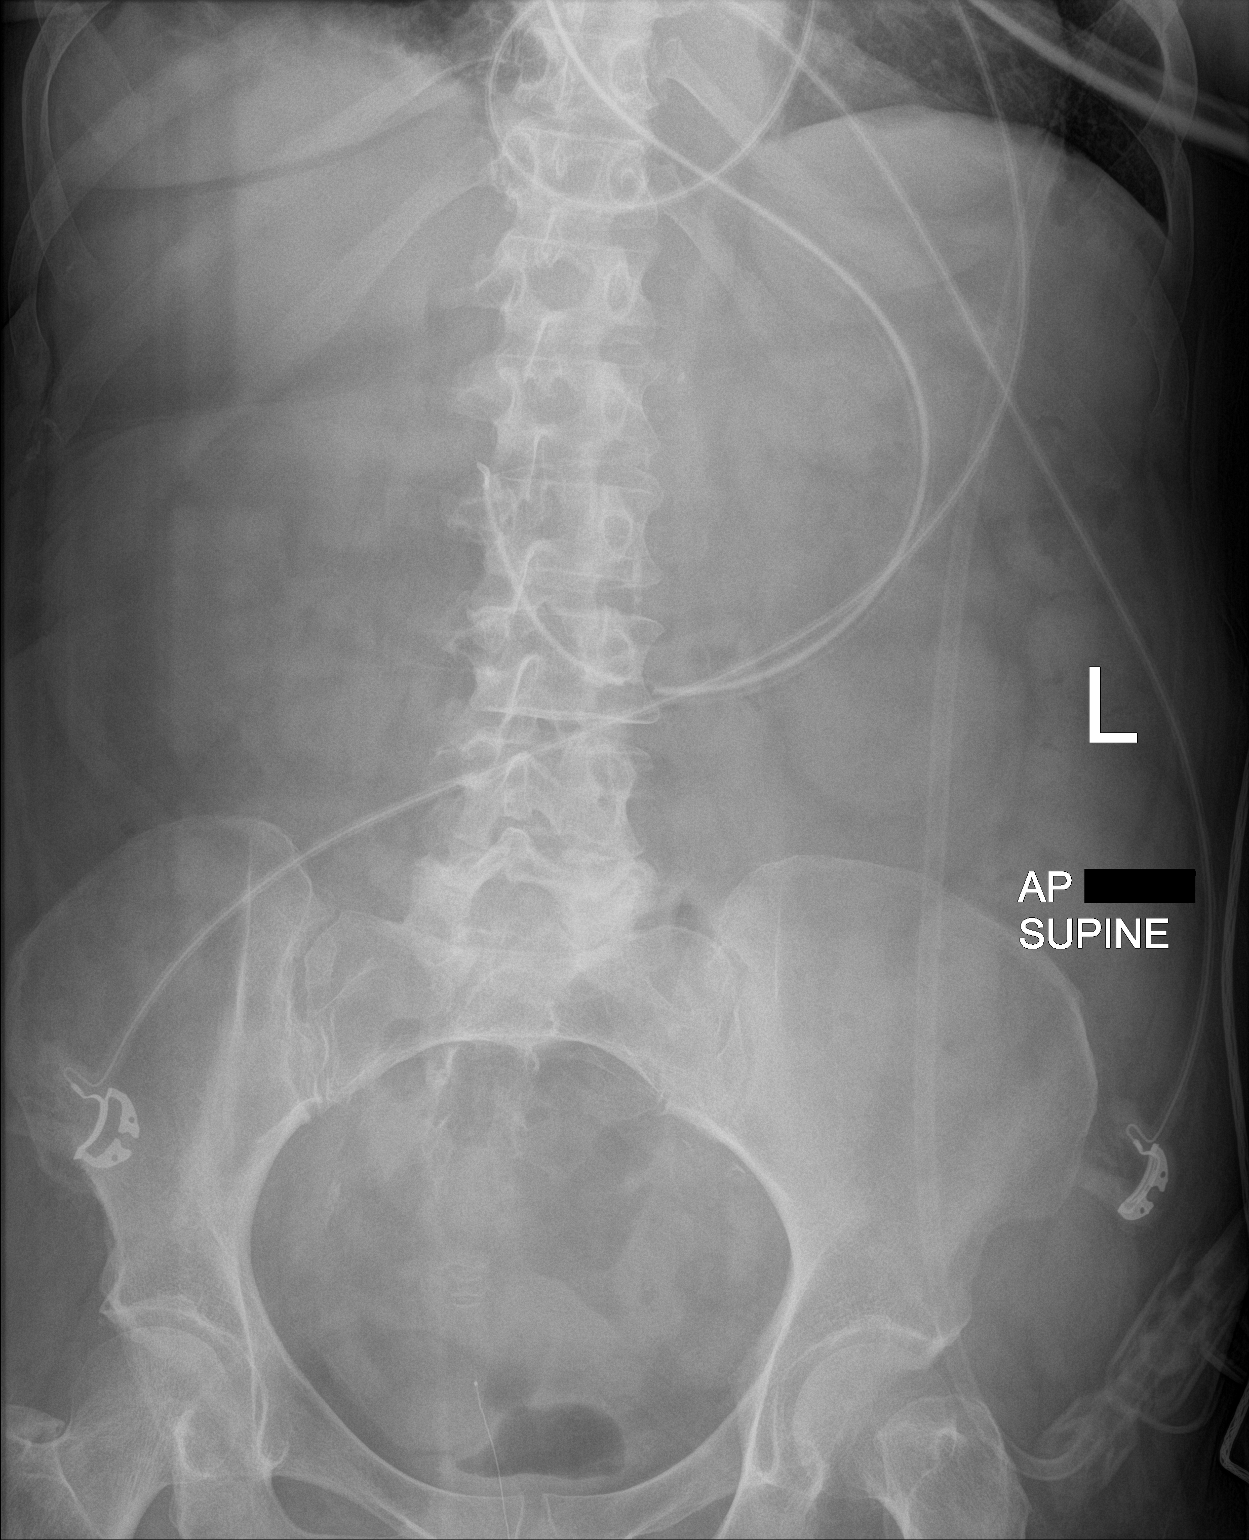

[1 of 1 positions shown; findings below may reference images not displayed]

FINDINGS: Portable AP supine view at 6330 hours. Paucity of bowel gas. Small
volume of gas in the rectum. No dilated loops are evident. Other
abdominal and pelvic visceral contours are within normal limits.
Lower lumbar facet degeneration. No acute osseous abnormality
identified. Asymmetric right lung base opacity persists.
IMPRESSION: Mostly gasless abdomen.  No dilated bowel loops are evident.
